# Patient Record
Sex: Female | Born: 1935 | Race: White | Hispanic: No | Smoking: Former smoker
Health system: Southern US, Community
[De-identification: ages and names within clinical notes are randomized; demographics above are authoritative.]

## PROBLEM LIST (undated history)

## (undated) DIAGNOSIS — R011 Cardiac murmur, unspecified: Secondary | ICD-10-CM

## (undated) DIAGNOSIS — F0390 Unspecified dementia without behavioral disturbance: Secondary | ICD-10-CM

## (undated) DIAGNOSIS — J449 Chronic obstructive pulmonary disease, unspecified: Secondary | ICD-10-CM

## (undated) DIAGNOSIS — I719 Aortic aneurysm of unspecified site, without rupture: Secondary | ICD-10-CM

## (undated) HISTORY — PX: FRACTURE SURGERY: SHX138

## (undated) HISTORY — PX: COLONOSCOPY: SHX174

## (undated) HISTORY — PX: ESOPHAGOGASTRODUODENOSCOPY: SHX1529

---

## 2016-02-03 DIAGNOSIS — E039 Hypothyroidism, unspecified: Secondary | ICD-10-CM | POA: Diagnosis present

## 2016-02-03 DIAGNOSIS — F0390 Unspecified dementia without behavioral disturbance: Secondary | ICD-10-CM | POA: Diagnosis present

## 2016-02-03 DIAGNOSIS — I10 Essential (primary) hypertension: Secondary | ICD-10-CM | POA: Diagnosis present

## 2016-05-04 DIAGNOSIS — K5730 Diverticulosis of large intestine without perforation or abscess without bleeding: Secondary | ICD-10-CM | POA: Insufficient documentation

## 2018-06-06 ENCOUNTER — Other Ambulatory Visit: Payer: Self-pay | Admitting: Student

## 2018-06-06 DIAGNOSIS — K5909 Other constipation: Secondary | ICD-10-CM

## 2018-06-06 DIAGNOSIS — R1084 Generalized abdominal pain: Secondary | ICD-10-CM

## 2018-06-06 DIAGNOSIS — R140 Abdominal distension (gaseous): Secondary | ICD-10-CM

## 2018-06-09 ENCOUNTER — Ambulatory Visit
Admission: RE | Admit: 2018-06-09 | Discharge: 2018-06-09 | Disposition: A | Discharge status: Home / Self Care | Payer: Medicare Other | Source: Ambulatory Visit | Attending: Student | Admitting: Student

## 2018-06-09 DIAGNOSIS — K5909 Other constipation: Secondary | ICD-10-CM | POA: Insufficient documentation

## 2018-06-09 DIAGNOSIS — M5136 Other intervertebral disc degeneration, lumbar region: Secondary | ICD-10-CM | POA: Diagnosis not present

## 2018-06-09 DIAGNOSIS — R140 Abdominal distension (gaseous): Secondary | ICD-10-CM | POA: Diagnosis not present

## 2018-06-09 DIAGNOSIS — K5730 Diverticulosis of large intestine without perforation or abscess without bleeding: Secondary | ICD-10-CM | POA: Diagnosis not present

## 2018-06-09 DIAGNOSIS — R1084 Generalized abdominal pain: Secondary | ICD-10-CM | POA: Insufficient documentation

## 2018-06-09 DIAGNOSIS — M5137 Other intervertebral disc degeneration, lumbosacral region: Secondary | ICD-10-CM | POA: Insufficient documentation

## 2018-06-09 DIAGNOSIS — N289 Disorder of kidney and ureter, unspecified: Secondary | ICD-10-CM | POA: Insufficient documentation

## 2018-06-09 DIAGNOSIS — N281 Cyst of kidney, acquired: Secondary | ICD-10-CM | POA: Diagnosis not present

## 2018-06-09 DIAGNOSIS — K4020 Bilateral inguinal hernia, without obstruction or gangrene, not specified as recurrent: Secondary | ICD-10-CM | POA: Insufficient documentation

## 2018-06-09 LAB — POCT I-STAT CREATININE: CREATININE: 0.5 mg/dL (ref 0.44–1.00)

## 2018-06-09 MED ORDER — IOPAMIDOL (ISOVUE-300) INJECTION 61%
85.0000 mL | Freq: Once | INTRAVENOUS | Status: AC | PRN
  Administered 2018-06-09: 85 mL via INTRAVENOUS

## 2019-01-24 IMAGING — CT CT ABD-PELV W/ CM
2 of 5 series · 14 of 46 positions shown, 16 images · IV contrast (iopamidol)
Comparison: None.

CLINICAL DATA: Protrusion of the right groin region, worsening of
abdominal bloating

EXAM:
CT ABDOMEN AND PELVIS WITH CONTRAST
TECHNIQUE: Multidetector CT imaging of the abdomen and pelvis was performed
using the standard protocol following bolus administration of
intravenous contrast.
CONTRAST:  85mL YO8HT8-LGG IOPAMIDOL (YO8HT8-LGG) INJECTION 61%

[Series 2: abd pelvis · axial · 0.71mm/px · z∈[-1574,-1224]mm · 11 of 85 slices shown, 13 images (1 of 2)]
[im 8/85  soft-tissue]
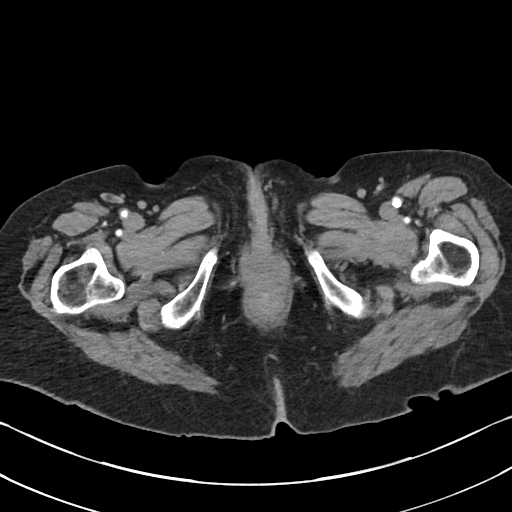
[im 8/85  bone]
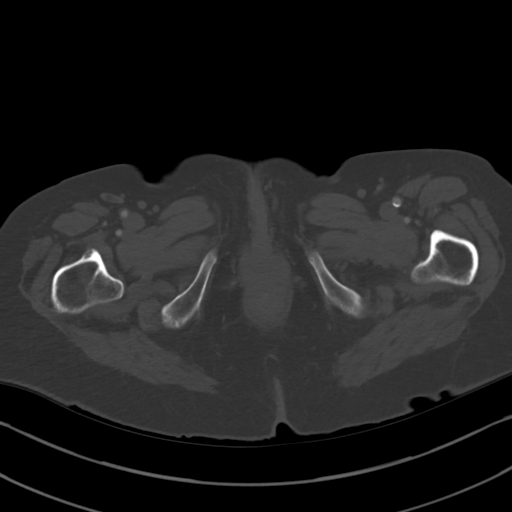
[im 15/85  soft-tissue]
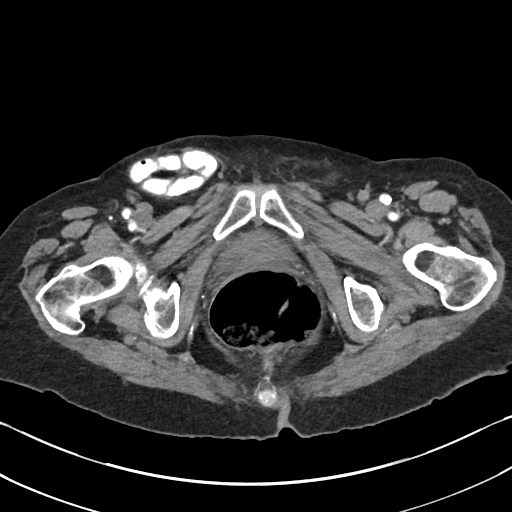
[im 22/85  soft-tissue]
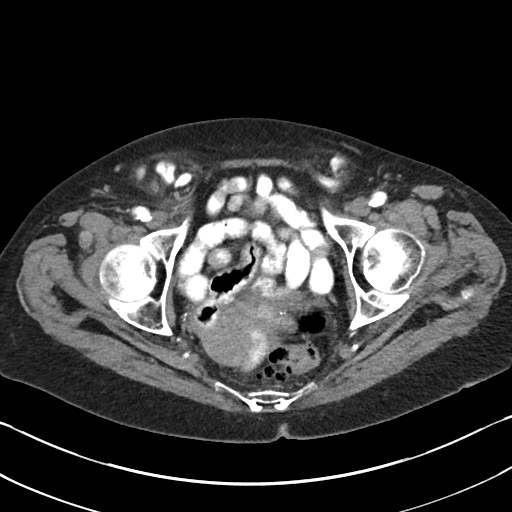
[im 29/85  soft-tissue]
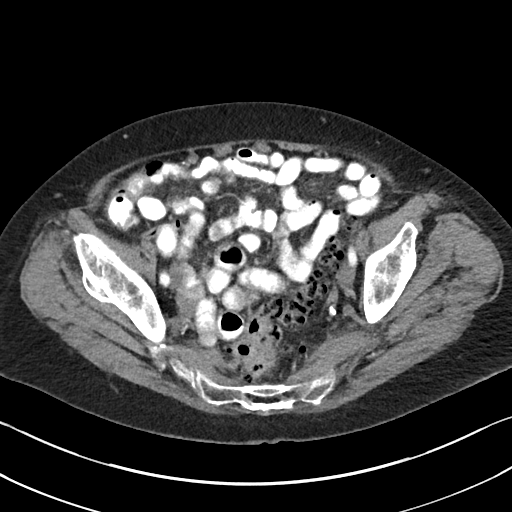
[im 36/85  soft-tissue]
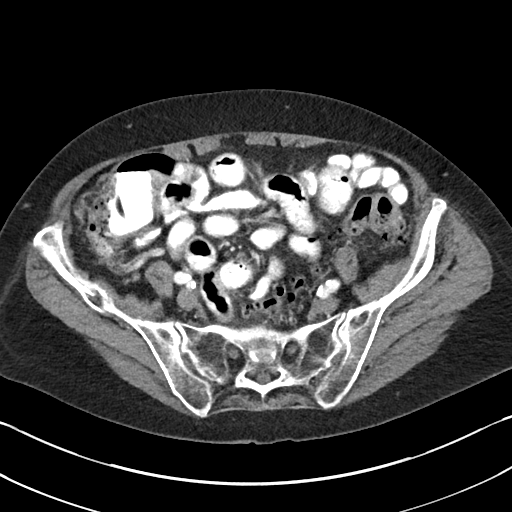
[im 43/85  soft-tissue]
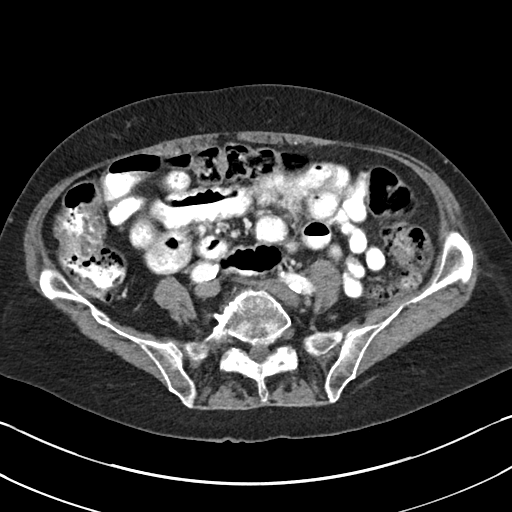
[im 50/85  soft-tissue]
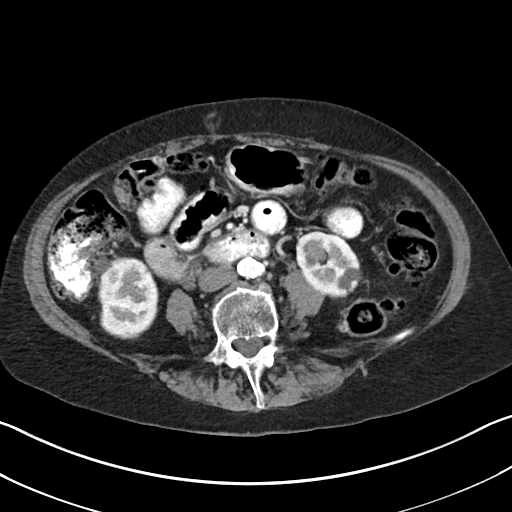
[im 57/85  soft-tissue]
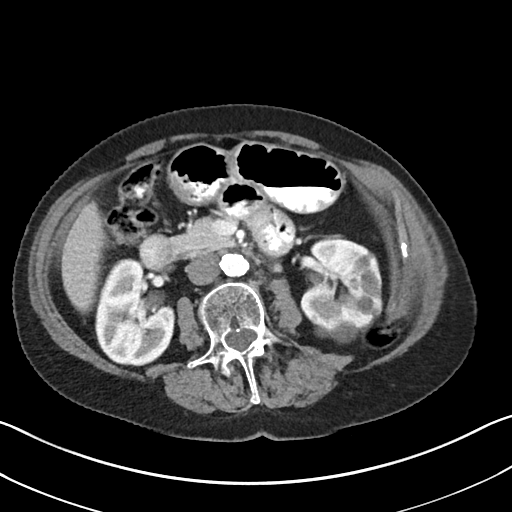
[im 64/85  soft-tissue]
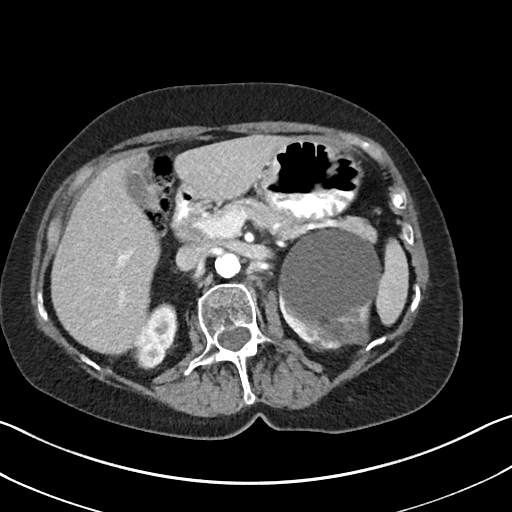
[im 64/85  bone]
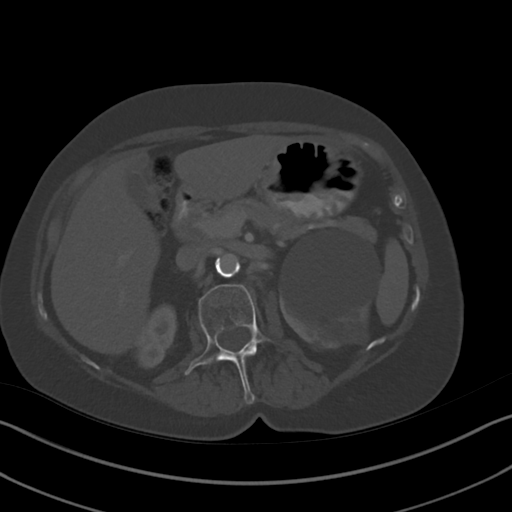
[im 71/85  soft-tissue]
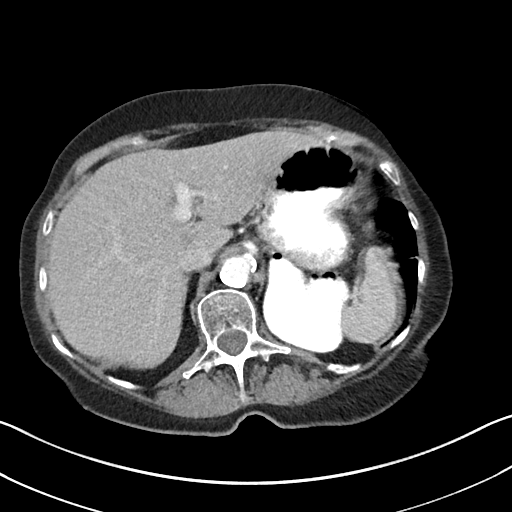
[im 78/85  soft-tissue]
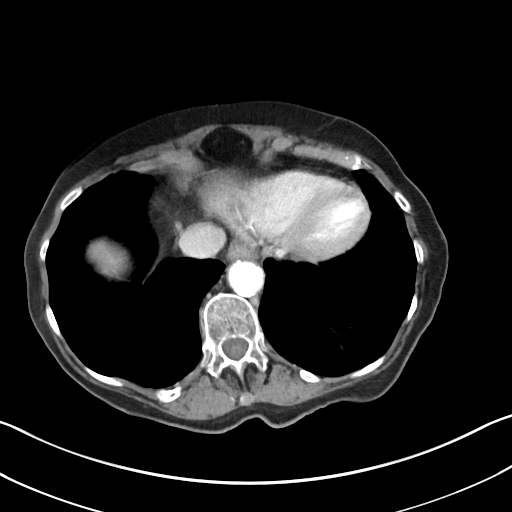

[Series 4: abd pelvis · coronal · 0.73mm/px · 3 of 130 slices shown (2 of 2)]
[im 44/130  soft-tissue]
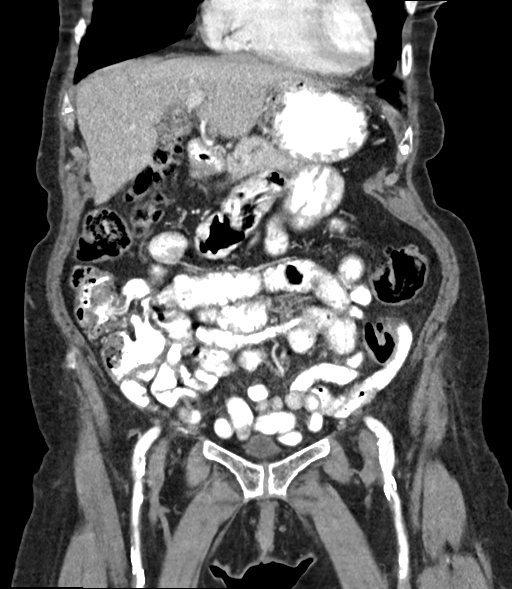
[im 58/130  soft-tissue]
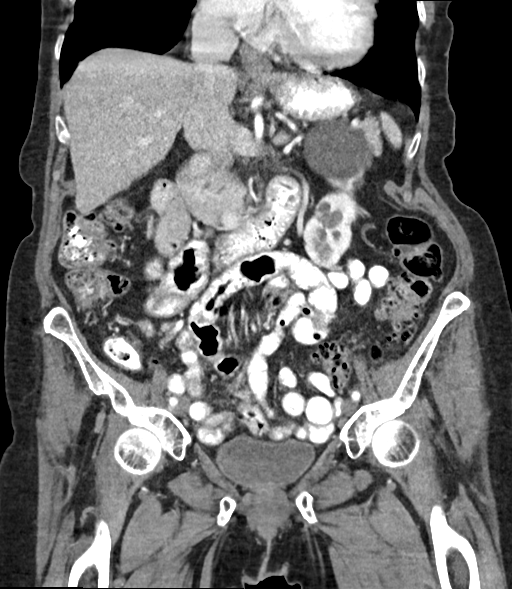
[im 72/130  soft-tissue]
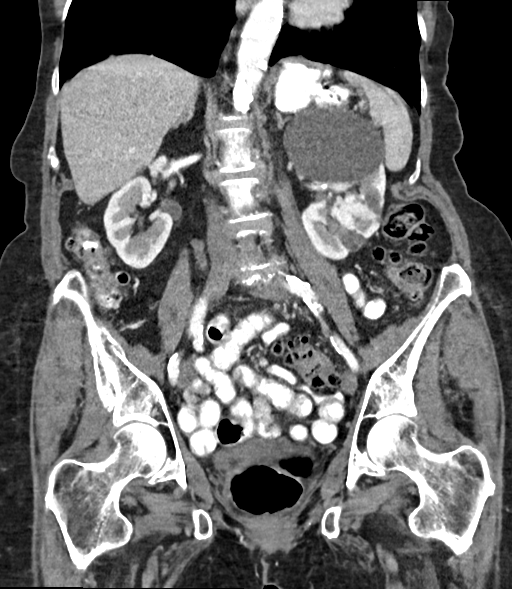

[14 of 46 positions shown; findings below may reference images not displayed]

FINDINGS: Lower chest: The lung bases are clear. Small Bochdalek's hernias
containing fat noted posteriorly bilaterally. Cardiomegaly is
present. No pericardial effusion is noted.

Hepatobiliary: The liver enhances with only a tiny low-attenuation
structure in the left lobe consistent with tiny cyst. No ductal
dilatation is seen. The gallbladder is contracted and no calcified
gallstones are noted.

Pancreas: The pancreas is normal in size and the pancreatic duct is
not dilated.

Spleen: The spleen is unremarkable.

Adrenals/Urinary Tract: The adrenal glands appear normal. Renal
cysts are noted bilaterally with the largest cysts the left. In the
upper pole on the left there is a cyst 6.3 x 7.0 cm. No solid renal
lesion is seen. There are several small nonobstructing left renal
calculi present. On delayed images there is no evidence of
hydronephrosis. The ureters appear normal in caliber. The urinary
bladder is not well distended but no abnormality is seen.

Stomach/Bowel: The stomach is moderately distended with oral
contrast and there is posterior extension of the fundus of the
stomach which could represent a large fundal diverticulum. No small
bowel dilatation is seen. There are multiple rectosigmoid colon
diverticula noted extending into the descending colon. Also
diverticula are scattered throughout the more proximal colon as
well. No diverticulitis is seen. The appendix and terminal ileum are
unremarkable. However, bilateral inguinal hernias are present, right
larger than left, containing loops of small bowel which are not
dilated. No fluid is seen within either canal.

Vascular/Lymphatic: The abdominal aorta is normal in caliber with
significant abdominal aortic atherosclerosis present. No adenopathy
is seen.

Reproductive: The uterus is normal in size. No adnexal lesion is
seen. No fluid is noted within pelvis.

Other: No anterior abdominal wall hernia is noted.

Musculoskeletal: There is very minimal retrolisthesis of L4 on L5
most likely due to degenerative change of the facet joints.
Degenerative disc disease is noted at both L4-5 and L5-S1 levels.
IMPRESSION: 1. Bilateral inguinal hernias, right larger than left, containing
nondilated loops of small bowel.
2. Nonobstructing left renal lesions. Bilateral renal cysts, the
largest on the left.
3. Multiple colonic diverticula.
4. Degenerative disc disease at L4-5 and L5-S1.

## 2019-02-28 DIAGNOSIS — J449 Chronic obstructive pulmonary disease, unspecified: Secondary | ICD-10-CM | POA: Diagnosis present

## 2019-02-28 DIAGNOSIS — Z720 Tobacco use: Secondary | ICD-10-CM | POA: Insufficient documentation

## 2019-05-05 ENCOUNTER — Telehealth: Payer: Self-pay | Admitting: *Deleted

## 2019-05-05 NOTE — Telephone Encounter (Signed)
Left voicemail regarding lung screening referral, encouraged patient to call back.

## 2019-05-18 ENCOUNTER — Telehealth: Payer: Self-pay | Admitting: *Deleted

## 2019-05-18 DIAGNOSIS — Z122 Encounter for screening for malignant neoplasm of respiratory organs: Secondary | ICD-10-CM

## 2019-05-18 DIAGNOSIS — Z87891 Personal history of nicotine dependence: Secondary | ICD-10-CM

## 2019-05-18 NOTE — Telephone Encounter (Signed)
Received referral for initial lung cancer screening scan. Contacted patient and obtained smoking history,(current, 63 pack year) as well as answering questions related to screening process. Patient denies signs of lung cancer such as weight loss or hemoptysis. Patient denies comorbidity that would prevent curative treatment if lung cancer were found. Patient is scheduled for shared decision making visit and CT scan on 05/25/19 at 1245pm.

## 2019-05-25 ENCOUNTER — Ambulatory Visit
Admission: RE | Admit: 2019-05-25 | Discharge: 2019-05-25 | Disposition: A | Discharge status: Home / Self Care | Payer: Medicare Other | Source: Ambulatory Visit | Attending: Oncology | Admitting: Oncology

## 2019-05-25 ENCOUNTER — Other Ambulatory Visit: Payer: Self-pay

## 2019-05-25 ENCOUNTER — Encounter: Payer: Self-pay | Admitting: Oncology

## 2019-05-25 ENCOUNTER — Inpatient Hospital Stay: Payer: Medicare Other | Attending: Oncology | Admitting: Oncology

## 2019-05-25 DIAGNOSIS — F17210 Nicotine dependence, cigarettes, uncomplicated: Secondary | ICD-10-CM | POA: Insufficient documentation

## 2019-05-25 DIAGNOSIS — J432 Centrilobular emphysema: Secondary | ICD-10-CM | POA: Insufficient documentation

## 2019-05-25 DIAGNOSIS — Z122 Encounter for screening for malignant neoplasm of respiratory organs: Secondary | ICD-10-CM | POA: Diagnosis present

## 2019-05-25 DIAGNOSIS — I712 Thoracic aortic aneurysm, without rupture: Secondary | ICD-10-CM | POA: Diagnosis not present

## 2019-05-25 DIAGNOSIS — Z87891 Personal history of nicotine dependence: Secondary | ICD-10-CM

## 2019-05-26 ENCOUNTER — Encounter: Payer: Self-pay | Admitting: *Deleted

## 2019-05-26 ENCOUNTER — Telehealth: Payer: Self-pay | Admitting: *Deleted

## 2019-05-26 NOTE — Telephone Encounter (Signed)
Notified patient's daughter of LDCT lung cancer screening program results with recommendation for 12 month follow up imaging. Also notified of incidental findings noted below and is encouraged to discuss further with PCP who will receive a copy of this note and/or the CT report. Discussed importance of 12 month follow up for ascending aortic aneurysm and small lung nodules. Also discussed that patient will not be followed in lung screening program due to age but can be followed by her PCP or by our lung nodule program.   IMPRESSION: 1. Lung-RADS 2, benign appearance or behavior. Continue annual screening with low-dose chest CT without contrast in 12 months. 2. 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 Recommend annual imaging followup by CTA or MRA. This recommendation follows 2010 ACCF/AHA/AATS/ACR/ASA/SCA/SCAI/SIR/STS/SVM Guidelines for the Diagnosis and Management of Patients with Thoracic Aortic Disease. Circulation. 2010; 121: I338-S505. Aortic aneurysm NOS (ICD10-I71.9) 3. Enlargement of the main pulmonary arteries is compatible with pulmonary arterial hypertension.

## 2019-05-29 NOTE — Progress Notes (Signed)
Virtual Visit via Video Note  I connected with Paige Parker on 05/29/19 at 12:45 PM EDT by a video enabled telemedicine application and verified that I am speaking with the correct person using two identifiers.  Location: Patient: [facility] Provider: Home   I discussed the limitations of evaluation and management by telemedicine and the availability of in person appointments. The patient expressed understanding and agreed to proceed.  I discussed the assessment and treatment plan with the patient. The patient was provided an opportunity to ask questions and all were answered. The patient agreed with the plan and demonstrated an understanding of the instructions.   The patient was advised to call back or seek an in-person evaluation if the symptoms worsen or if the condition fails to improve as anticipated.   In accordance with CMS guidelines, patient has met eligibility criteria including age, absence of signs or symptoms of lung cancer.  Social History   Tobacco Use  . Smoking status: Current Every Day Smoker    Packs/day: 1.00    Years: 63.00    Pack years: 63.00    Types: Cigarettes  Substance Use Topics  . Alcohol use: Not on file  . Drug use: Not on file      A shared decision-making session was conducted prior to the performance of CT scan. This includes one or more decision aids, includes benefits and harms of screening, follow-up diagnostic testing, over-diagnosis, false positive rate, and total radiation exposure.   Counseling on the importance of adherence to annual lung cancer LDCT screening, impact of co-morbidities, and ability or willingness to undergo diagnosis and treatment is imperative for compliance of the program.   Counseling on the importance of continued smoking cessation for former smokers; the importance of smoking cessation for current smokers, and information about tobacco cessation interventions have been given to patient including [place] and [address]  quit [place] programs.   Written order for lung cancer screening with LDCT has been given to the patient and any and all questions have been answered to the best of my abilities.    Yearly follow up will be coordinated by Burgess Estelle, Thoracic Navigator.  I provided 10 minutes of face-to-face video visit time during this encounter, and > 50% was spent counseling as documented under my assessment & plan.   Jacquelin Hawking, NP

## 2019-08-31 DIAGNOSIS — I7121 Aneurysm of the ascending aorta, without rupture: Secondary | ICD-10-CM | POA: Diagnosis present

## 2020-06-06 ENCOUNTER — Other Ambulatory Visit: Payer: Self-pay | Admitting: Internal Medicine

## 2020-06-06 DIAGNOSIS — I7121 Aneurysm of the ascending aorta, without rupture: Secondary | ICD-10-CM

## 2020-06-06 DIAGNOSIS — Z0000 Encounter for general adult medical examination without abnormal findings: Secondary | ICD-10-CM

## 2020-10-15 ENCOUNTER — Other Ambulatory Visit: Payer: Self-pay | Admitting: Oncology

## 2020-10-15 DIAGNOSIS — R911 Solitary pulmonary nodule: Secondary | ICD-10-CM

## 2020-10-15 NOTE — Progress Notes (Signed)
  Pulmonary Nodule Clinic Telephone Note [facility]   Received referral from Glenna Fellows our low-dose CT screening nurse navigator.  HPI: Mrs. Alumbaugh is a 84 year old female with past medical history significant for COPD, B12 deficiency, arthritis, anemia, hypothyroidism, dementia, hypertension, GERD, basal cell carcinoma and iron deficiency anemia who was previously part of our low-dose CT screening program but unfortunately does not meet criteria.  Her last CT scan from June 2020 showed a 4.3 mm pulmonary nodule.  Follow-up recommended.  Review and Recommendations: I personally reviewed all patient's previous imaging including most recent low-dose CT scan from June 2020.  I recommend follow-up with noncontrast chest CT in the next 1 to 2 weeks given smoking history.  Social History:   Tobacco Use:   . Smoking Tobacco Use: Not on file  . Smokeless Tobacco Use: Not on file     High risk factors include: History of heavy smoking, exposure to asbestos, radium or uranium, personal family history of lung cancer, older age, sex (females greater than males), race (black and native [place] greater than weight), marginal speculation, upper lobe location, multiplicity (less than 5 nodules increases risk for malignancy) and emphysema and/or pulmonary fibrosis.   This recommendation follows the consensus statement: Guidelines for Management of Incidental Pulmonary Nodules Detected on CT Images: From the Fleischner Society 2017; Radiology 2017; 284:228-243.    I have placed order for CT scan without contrast to be completed approximately 1 to 2 weeks.  Disposition: Order placed for repeat CT chest. Will notify Micael Hampshire in scheduling. [address] to call patient with appointment date and time. Return to pulmonary nodule clinic a few days after his repeat imaging to discuss results and plan moving forward.  Durenda Hurt, NP 10/15/2020 3:52 PM

## 2020-10-16 ENCOUNTER — Telehealth: Payer: Self-pay | Admitting: *Deleted

## 2020-10-16 NOTE — Telephone Encounter (Signed)
Referral received for pt to be seen in Lung Nodule Clinic for further workup of incidental lung nodule with follow up CT scan. Left message with patient to call back to discuss clinic and review recommendations and upcoming appts including follow up CT scan and visit with Jenny Burns, NP to discuss results. Awaiting call back.  

## 2020-10-28 ENCOUNTER — Telehealth: Payer: Self-pay | Admitting: *Deleted

## 2020-10-28 NOTE — Telephone Encounter (Signed)
Message left with pt's daughter, Darl Pikes, regarding upcoming appts in the lung nodule clinic. Appts left of voicemail. Instructed to call back if need to be rescheduled.

## 2020-10-29 ENCOUNTER — Ambulatory Visit
Admission: RE | Admit: 2020-10-29 | Discharge: 2020-10-29 | Disposition: A | Discharge status: Home / Self Care | Payer: Medicare Other | Source: Ambulatory Visit | Attending: Oncology | Admitting: Oncology

## 2020-10-29 ENCOUNTER — Other Ambulatory Visit: Payer: Self-pay

## 2020-10-29 DIAGNOSIS — R911 Solitary pulmonary nodule: Secondary | ICD-10-CM | POA: Diagnosis not present

## 2020-10-30 ENCOUNTER — Inpatient Hospital Stay: Payer: Medicare Other | Attending: Oncology | Admitting: Oncology

## 2020-10-30 DIAGNOSIS — E039 Hypothyroidism, unspecified: Secondary | ICD-10-CM | POA: Diagnosis not present

## 2020-10-30 DIAGNOSIS — F0390 Unspecified dementia without behavioral disturbance: Secondary | ICD-10-CM | POA: Insufficient documentation

## 2020-10-30 DIAGNOSIS — I700 Atherosclerosis of aorta: Secondary | ICD-10-CM | POA: Diagnosis not present

## 2020-10-30 DIAGNOSIS — F17210 Nicotine dependence, cigarettes, uncomplicated: Secondary | ICD-10-CM | POA: Insufficient documentation

## 2020-10-30 DIAGNOSIS — J449 Chronic obstructive pulmonary disease, unspecified: Secondary | ICD-10-CM | POA: Diagnosis not present

## 2020-10-30 DIAGNOSIS — K219 Gastro-esophageal reflux disease without esophagitis: Secondary | ICD-10-CM | POA: Insufficient documentation

## 2020-10-30 DIAGNOSIS — I2510 Atherosclerotic heart disease of native coronary artery without angina pectoris: Secondary | ICD-10-CM | POA: Diagnosis not present

## 2020-10-30 DIAGNOSIS — Z85828 Personal history of other malignant neoplasm of skin: Secondary | ICD-10-CM | POA: Diagnosis not present

## 2020-10-30 DIAGNOSIS — I10 Essential (primary) hypertension: Secondary | ICD-10-CM | POA: Diagnosis not present

## 2020-10-30 DIAGNOSIS — R911 Solitary pulmonary nodule: Secondary | ICD-10-CM | POA: Diagnosis present

## 2020-10-30 NOTE — Progress Notes (Signed)
Pulmonary Nodule Clinic Consult note [facility]  [phone] [phone]  Parker Care Team: Barbette Reichmann, MD as PCP - General (Internal Medicine)   Name of the Parker: Paige Parker  [id number]  1936/05/23   Date of visit: 10/30/2020   Diagnosis- Lung Nodule  Chief complaint/ Reason for visit- Pulmonary Nodule Clinic Initial Visit  Past Medical History:  Parker is managed/referred by LDCT screening program   Paige Parker is a 84 year old female with past medical history significant for COPD, B12 deficiency, arthritis, anemia, hypothyroidism, dementia, hypertension, GERD, basal cell carcinoma and iron deficiency anemia who was previously part of our low-dose CT screening program but unfortunately does not meet criteria.  Her last CT scan from June 2020 showed a 4.3 mm pulmonary nodule. Follow-up recommended.  Interval history-Paige Parker presents today to discuss her recent CT scan.  She is doing well and lives at [facility] independent living.  Her daughter lives about a mile down the street.  She is mainly independent.  She does use a walker for long distances and balance.   She is a current smoker.  She smokes about a pack every week.  She has smoked for about 55 years.  She used to smoke a pack per day.  She currently feels well and denies any problems with her breathing.  She has quit smoking in the past for several years.  Her husband used to smoke.  He is now deceased.  She denies any occupational exposures.  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  She denies any known family history of cancer.  She does have a personal history of basal cell carcinoma on her face which was removed.  She is followed closely by her primary care provider Dr. Marcello Fennel.  Her TSH was significantly elevated on her last visit.  She is scheduled for follow-up in the next couple weeks.  She has adjusted her  Synthroid to 125 mcg per the request of Dr. Marcello Fennel. Denies any neurologic complaints. Denies recent fevers or illnesses. Denies any easy bleeding or bruising. Reports good appetite and denies weight loss. Denies chest pain. Denies any nausea, vomiting, constipation, or diarrhea. Denies urinary complaints. Parker offers no further specific complaints today.   ECOG FS:1 - Symptomatic but completely ambulatory  Review of systems- Review of Systems  Constitutional: Negative.  Negative for chills, fever, malaise/fatigue and weight loss.  HENT: Negative for congestion, ear pain and tinnitus.   Eyes: Negative.  Negative for blurred vision and double vision.  Respiratory: Negative.  Negative for cough, sputum production and shortness of breath.   Cardiovascular: Negative.  Negative for chest pain, palpitations and leg swelling.  Gastrointestinal: Negative.  Negative for abdominal pain, constipation, diarrhea, nausea and vomiting.  Genitourinary: Negative for dysuria, frequency and urgency.  Musculoskeletal: Negative for back pain and falls.  Skin: Negative.  Negative for rash.  Neurological: Negative.  Negative for weakness and headaches.  Endo/Heme/Allergies: Negative.  Does not bruise/bleed easily.  Psychiatric/Behavioral: Negative.  Negative for depression. The Parker is not nervous/anxious and does not have insomnia.      No Known Allergies   No past medical history on file.   Social History   Socioeconomic History  . Marital status: Widowed    Spouse name: Not on file  . Number of children: Not on file  . Years of education: Not on file  . Highest education level: Not on file  Occupational History  . Not on file  Tobacco Use  . Smoking status: Current Every Day Smoker    Packs/day: 1.00    Years: 63.00    Pack years: 63.00    Types: Cigarettes  Substance and Sexual Activity  . Alcohol use: Not on file  . Drug use: Not on file  . Sexual activity: Not on file  Other Topics  Concern  . Not on file  Social History Narrative  . Not on file   Social Determinants of Health   Financial Resource Strain:   . Difficulty of Paying Living Expenses: Not on file  Food Insecurity:   . Worried About Programme researcher, broadcasting/film/videounning Out of Food in the Last Year: Not on file  . Ran Out of Food in the Last Year: Not on file  Transportation Needs:   . Lack of Transportation (Medical): Not on file  . Lack of Transportation (Non-Medical): Not on file  Physical Activity:   . Days of Exercise per Week: Not on file  . Minutes of Exercise per Session: Not on file  Stress:   . Feeling of Stress : Not on file  Social Connections:   . Frequency of Communication with Friends and Family: Not on file  . Frequency of Social Gatherings with Friends and Family: Not on file  . Attends Religious Services: Not on file  . Active Member of Clubs or Organizations: Not on file  . Attends BankerClub or Organization Meetings: Not on file  . Marital Status: Not on file  Intimate Partner Violence:   . Fear of Current or Ex-Partner: Not on file  . Emotionally Abused: Not on file  . Physically Abused: Not on file  . Sexually Abused: Not on file    No family history on file.  No current outpatient medications on file.  Physical exam: There were no vitals filed for Paige visit. Physical Exam   CMP Latest Ref Rng & Units 06/09/2018  Creatinine 0.44 - 1.00 mg/dL 4.090.50   No flowsheet data found.  No images are attached to the encounter.  CT Chest Wo Contrast  Result Date: 10/29/2020 CLINICAL DATA:  Follow-up pulmonary nodules EXAM: CT CHEST WITHOUT CONTRAST TECHNIQUE: Multidetector CT imaging of the chest was performed following the standard protocol without IV contrast. COMPARISON:  05/25/2019 FINDINGS: Cardiovascular: Aortic atherosclerosis. Unchanged enlargement of the tubular ascending thoracic aorta, measuring up to 4.4 x 4.3 cm. Normal heart size. Three-vessel coronary artery calcifications. Gross enlargement of  the main pulmonary artery, measuring up to 4.7 cm in caliber. No pericardial effusion. Mediastinum/Nodes: No enlarged mediastinal, hilar, or axillary lymph nodes. Thyroid gland, trachea, and esophagus demonstrate no significant findings. Lungs/Pleura: Moderate centrilobular emphysema. Diffuse bilateral bronchial wall thickening. Occasional small pulmonary nodules are stable and definitively benign, for example a 4 mm nodule of the left lower lobe (series 3, image 135). No pleural effusion or pneumothorax. Upper Abdomen: No acute abnormality. Large diverticulum of the gastric fundus (series 2, image 145). Large, partially imaged exophytic cyst of the superior pole of the left kidney. Musculoskeletal: No chest wall mass or suspicious bone lesions identified. IMPRESSION: 1. Occasional small pulmonary nodules are stable and definitively benign. No further routine CT follow-up is required for these nodules. 2. Moderate emphysema and diffuse bilateral bronchial wall thickening. 3. Unchanged enlargement of the tubular ascending thoracic aorta, measuring up to 4.4 x 4.3 cm. Recommend annual imaging followup by CTA or MRA. Paige recommendation follows 2010 ACCF/AHA/AATS/ACR/ASA/SCA/SCAI/SIR/STS/SVM Guidelines for the Diagnosis and Management of Patients with  Thoracic Aortic Disease. Circulation. 2010; 121: W098-J191. Aortic aneurysm NOS (ICD10-I71.9) 4. Gross enlargement of the main pulmonary artery, measuring up to 4.7 cm in caliber, which can be seen in pulmonary hypertension. 5. Coronary artery disease. Aortic Atherosclerosis (ICD10-I70.0) and Emphysema (ICD10-J43.9). Electronically Signed   By: Lauralyn Primes M.D.   On: 10/29/2020 10:24     Assessment and plan- Parker is a 84 y.o. female who presents to pulmonary nodule clinic for follow-up of incidental lung nodules.     CT chest without contrast from from 10/29/2020 shows occasional small pulmonary nodules that are stable and a definitively benign.  No further  routine CT follow-up is required for these nodules.  Moderate emphysema and diffuse bilateral bronchial wall thickening.  Unchanged enlargement of the tubular ascending thoracic aorta measuring up to 4.4 x 4.3 cm.  Recommend annual imaging follow-up by CTA or M RA.  There is a gross enlargement of the main pulmonary artery measuring up to 4.7 cm in caliber which can be seen in pulmonary hypertension.   Calculating malignancy probability of a pulmonary nodule: Risk factors include: 1.  Age. 2.  Cancer history. 3.  Diameter of pulmonary nodule and mm 4.  Location 5.  Smoking history 6.  Spiculation present   Based on risk factors, Paige Parker is  high risk for the development of lung cancer given her smoking history.  She no longer meets criteria for low-dose CT screening program and her's lung nodules have remained the same size for several years now.  No additional follow-up is needed at Paige time.  Will defer back to her primary care provider Dr. Marcello Fennel.     During our visit, we discussed pulmonary nodules are a common incidental finding and are often how lung cancer is discovered.  Lung cancer survival is directly related to the stage at diagnosis.  We discussed that nodules can vary in presentation from solitary pulmonary nodules to masses, 2 groundglass opacities and multiple nodules.  Pulmonary nodules in the majority of cases are benign but the probability of these becoming malignant cannot be undermined.  Early identification of malignant nodules could lead to early diagnosis and increased survival.   We discussed the probability of pulmonary nodules becoming malignant increase with age, pack years of tobacco use, size/characteristics of the nodule and location; with upper lobe involvement being most worrisome.   We discussed the goal of our clinic is to thoroughly evaluate each nodule, developed a comprehensive, individualized plan of care utilizing the most advanced technology and  significantly reduce the time from detection to treatment.  A dedicated pulmonary nodule clinic has proven to indeed expedite the detection and treatment of lung cancer.   Parker education in fact sheet provided along with most recent CT scans.  During our visit today, we discussed her past medical history, social history and family history.  She has no family history of cancer.  She has a personal history of basal cell carcinoma on her face several years ago.  Otherwise she is healthy.  Discussed occupational exposures which is none.   Disposition: No additional follow-up is needed in our clinic. Defer back to primary care provider. Recommend CTA or MRA in 1 year to follow-up on thoracic aorta aneurysm.   Visit Diagnosis 1. Pulmonary nodule     Parker expressed understanding and was in agreement with Paige plan. She also understands that She can call clinic at any time with any questions, concerns, or complaints.   Greater than 50% was spent  in counseling and coordination of care with Paige Parker including but not limited to discussion of the relevant topics above (See A&P) including, but not limited to diagnosis and management of acute and chronic medical conditions.   Thank you for allowing me to participate in the care of Paige very pleasant Parker.    Mauro Kaufmann, NP [facility] Cell - [phone] Pager- [phone] 10/30/2020 1:33 PM

## 2022-04-09 ENCOUNTER — Other Ambulatory Visit: Payer: Self-pay | Admitting: Internal Medicine

## 2022-04-09 DIAGNOSIS — I7121 Aneurysm of the ascending aorta, without rupture: Secondary | ICD-10-CM

## 2022-04-22 ENCOUNTER — Ambulatory Visit
Admission: RE | Admit: 2022-04-22 | Discharge: 2022-04-22 | Disposition: A | Discharge status: Home / Self Care | Payer: Medicare Other | Source: Ambulatory Visit | Attending: Internal Medicine | Admitting: Internal Medicine

## 2022-04-22 DIAGNOSIS — I7121 Aneurysm of the ascending aorta, without rupture: Secondary | ICD-10-CM | POA: Insufficient documentation

## 2022-04-22 MED ORDER — IOHEXOL 350 MG/ML SOLN
75.0000 mL | Freq: Once | INTRAVENOUS | Status: AC | PRN
  Administered 2022-04-22: 75 mL via INTRAVENOUS

## 2022-07-30 ENCOUNTER — Other Ambulatory Visit: Payer: Self-pay

## 2022-07-30 ENCOUNTER — Emergency Department: Payer: Medicare Other

## 2022-07-30 ENCOUNTER — Inpatient Hospital Stay
Admission: EM | Admit: 2022-07-30 | Discharge: 2022-08-01 | DRG: 378 | Disposition: A | Discharge status: Home / Self Care | Payer: Medicare Other | Source: Ambulatory Visit | Attending: Obstetrics and Gynecology | Admitting: Obstetrics and Gynecology

## 2022-07-30 ENCOUNTER — Encounter: Payer: Self-pay | Admitting: Emergency Medicine

## 2022-07-30 DIAGNOSIS — K921 Melena: Secondary | ICD-10-CM

## 2022-07-30 DIAGNOSIS — Z79899 Other long term (current) drug therapy: Secondary | ICD-10-CM | POA: Diagnosis not present

## 2022-07-30 DIAGNOSIS — Z79890 Hormone replacement therapy (postmenopausal): Secondary | ICD-10-CM

## 2022-07-30 DIAGNOSIS — D62 Acute posthemorrhagic anemia: Secondary | ICD-10-CM | POA: Diagnosis present

## 2022-07-30 DIAGNOSIS — I7121 Aneurysm of the ascending aorta, without rupture: Secondary | ICD-10-CM | POA: Diagnosis present

## 2022-07-30 DIAGNOSIS — K529 Noninfective gastroenteritis and colitis, unspecified: Secondary | ICD-10-CM | POA: Diagnosis present

## 2022-07-30 DIAGNOSIS — E876 Hypokalemia: Secondary | ICD-10-CM | POA: Diagnosis present

## 2022-07-30 DIAGNOSIS — K31811 Angiodysplasia of stomach and duodenum with bleeding: Principal | ICD-10-CM | POA: Diagnosis present

## 2022-07-30 DIAGNOSIS — K449 Diaphragmatic hernia without obstruction or gangrene: Secondary | ICD-10-CM | POA: Diagnosis present

## 2022-07-30 DIAGNOSIS — E162 Hypoglycemia, unspecified: Secondary | ICD-10-CM | POA: Diagnosis not present

## 2022-07-30 DIAGNOSIS — Z87891 Personal history of nicotine dependence: Secondary | ICD-10-CM

## 2022-07-30 DIAGNOSIS — J449 Chronic obstructive pulmonary disease, unspecified: Secondary | ICD-10-CM | POA: Diagnosis present

## 2022-07-30 DIAGNOSIS — F0390 Unspecified dementia without behavioral disturbance: Secondary | ICD-10-CM | POA: Diagnosis present

## 2022-07-30 DIAGNOSIS — K31819 Angiodysplasia of stomach and duodenum without bleeding: Secondary | ICD-10-CM

## 2022-07-30 DIAGNOSIS — E039 Hypothyroidism, unspecified: Secondary | ICD-10-CM | POA: Diagnosis present

## 2022-07-30 DIAGNOSIS — I10 Essential (primary) hypertension: Secondary | ICD-10-CM | POA: Diagnosis present

## 2022-07-30 DIAGNOSIS — K922 Gastrointestinal hemorrhage, unspecified: Secondary | ICD-10-CM

## 2022-07-30 HISTORY — DX: Cardiac murmur, unspecified: R01.1

## 2022-07-30 HISTORY — DX: Aortic aneurysm of unspecified site, without rupture: I71.9

## 2022-07-30 HISTORY — DX: Unspecified dementia, unspecified severity, without behavioral disturbance, psychotic disturbance, mood disturbance, and anxiety: F03.90

## 2022-07-30 HISTORY — DX: Chronic obstructive pulmonary disease, unspecified: J44.9

## 2022-07-30 LAB — CBC WITH DIFFERENTIAL/PLATELET
Abs Immature Granulocytes: 0.92 10*3/uL — ABNORMAL HIGH (ref 0.00–0.07)
Basophils Absolute: 0 10*3/uL (ref 0.0–0.1)
Basophils Relative: 0 %
Eosinophils Absolute: 0.1 10*3/uL (ref 0.0–0.5)
Eosinophils Relative: 0 %
HCT: 19.3 % — ABNORMAL LOW (ref 36.0–46.0)
Hemoglobin: 5.6 g/dL — ABNORMAL LOW (ref 12.0–15.0)
Immature Granulocytes: 7 %
Lymphocytes Relative: 9 %
Lymphs Abs: 1.2 10*3/uL (ref 0.7–4.0)
MCH: 23.1 pg — ABNORMAL LOW (ref 26.0–34.0)
MCHC: 29 g/dL — ABNORMAL LOW (ref 30.0–36.0)
MCV: 79.8 fL — ABNORMAL LOW (ref 80.0–100.0)
Monocytes Absolute: 0.8 10*3/uL (ref 0.1–1.0)
Monocytes Relative: 6 %
Neutro Abs: 10.4 10*3/uL — ABNORMAL HIGH (ref 1.7–7.7)
Neutrophils Relative %: 78 %
Platelets: 559 10*3/uL — ABNORMAL HIGH (ref 150–400)
RBC: 2.42 MIL/uL — ABNORMAL LOW (ref 3.87–5.11)
RDW: 17.3 % — ABNORMAL HIGH (ref 11.5–15.5)
Smear Review: NORMAL
WBC: 13.3 10*3/uL — ABNORMAL HIGH (ref 4.0–10.5)
nRBC: 1 % — ABNORMAL HIGH (ref 0.0–0.2)

## 2022-07-30 LAB — COMPREHENSIVE METABOLIC PANEL
ALT: 14 U/L (ref 0–44)
AST: 19 U/L (ref 15–41)
Albumin: 2.8 g/dL — ABNORMAL LOW (ref 3.5–5.0)
Alkaline Phosphatase: 66 U/L (ref 38–126)
Anion gap: 11 (ref 5–15)
BUN: 17 mg/dL (ref 8–23)
CO2: 25 mmol/L (ref 22–32)
Calcium: 8.4 mg/dL — ABNORMAL LOW (ref 8.9–10.3)
Chloride: 101 mmol/L (ref 98–111)
Creatinine, Ser: 0.61 mg/dL (ref 0.44–1.00)
GFR, Estimated: >60 mL/min (ref >60)
Glucose, Bld: 84 mg/dL (ref 70–99)
Potassium: 3.2 mmol/L — ABNORMAL LOW (ref 3.5–5.1)
Sodium: 137 mmol/L (ref 135–145)
Total Bilirubin: 0.7 mg/dL (ref 0.3–1.2)
Total Protein: 6.6 g/dL (ref 6.5–8.1)

## 2022-07-30 LAB — ABO/RH: ABO/RH(D): A POS

## 2022-07-30 LAB — APTT: aPTT: 27 seconds (ref 24–36)

## 2022-07-30 LAB — PROTIME-INR
INR: 1.2 (ref 0.8–1.2)
Prothrombin Time: 14.8 seconds (ref 11.4–15.2)

## 2022-07-30 LAB — TROPONIN I (HIGH SENSITIVITY)
Troponin I (High Sensitivity): 7 ng/L (ref <18)
Troponin I (High Sensitivity): 5 ng/L (ref <18)

## 2022-07-30 MED ORDER — ACETAMINOPHEN 325 MG PO TABS: 650.0000 mg | ORAL_TABLET | Freq: Four times a day (QID) | ORAL | Status: DC | PRN

## 2022-07-30 MED ORDER — ONDANSETRON HCL 4 MG PO TABS: 4.0000 mg | ORAL_TABLET | Freq: Four times a day (QID) | ORAL | Status: DC | PRN

## 2022-07-30 MED ORDER — SODIUM CHLORIDE 0.9 % IV SOLN: 10.0000 mL/h | Freq: Once | INTRAVENOUS | Status: DC

## 2022-07-30 MED ORDER — POTASSIUM CHLORIDE 10 MEQ/100ML IV SOLN
10.0000 meq | INTRAVENOUS | Status: AC
  Administered 2022-07-30 – 2022-07-31 (×4): 10 meq via INTRAVENOUS
  Filled 2022-07-30 (×4): qty 100

## 2022-07-30 MED ORDER — ONDANSETRON HCL 4 MG/2ML IJ SOLN: 4.0000 mg | Freq: Four times a day (QID) | INTRAMUSCULAR | Status: DC | PRN

## 2022-07-30 MED ORDER — ALBUTEROL SULFATE (2.5 MG/3ML) 0.083% IN NEBU: 2.5000 mg | INHALATION_SOLUTION | RESPIRATORY_TRACT | Status: DC | PRN

## 2022-07-30 MED ORDER — SODIUM CHLORIDE 0.9 % IV SOLN: INTRAVENOUS | Status: DC

## 2022-07-30 MED ORDER — PANTOPRAZOLE SODIUM 40 MG IV SOLR
40.0000 mg | Freq: Once | INTRAVENOUS | Status: AC
  Administered 2022-07-30: 40 mg via INTRAVENOUS
  Filled 2022-07-30: qty 10

## 2022-07-30 MED ORDER — ACETAMINOPHEN 650 MG RE SUPP: 650.0000 mg | Freq: Four times a day (QID) | RECTAL | Status: DC | PRN

## 2022-07-30 MED ORDER — PANTOPRAZOLE SODIUM 40 MG IV SOLR
40.0000 mg | Freq: Two times a day (BID) | INTRAVENOUS | Status: DC
  Administered 2022-07-31 – 2022-08-01 (×3): 40 mg via INTRAVENOUS
  Filled 2022-07-30 (×3): qty 10

## 2022-07-30 NOTE — ED Provider Triage Note (Signed)
Emergency Medicine Provider Triage Evaluation Note  Paige Parker , a 86 y.o. female  was evaluated in triage.  Pt complains of shortness of breath, decreased activity, decreased eating and drinking.  Symptoms have been ongoing for the last 4 to 7 days.  No reported fevers, chest pain, Donnell pain.  History of COPD, AAA.Marland Kitchen  Review of Systems  Positive: Weakness, shortness of breath, decreased appetite Negative: Fevers, chest pain, emesis, diarrhea, urinary changes  Physical Exam  BP (!) 105/94 (BP Location: Right Arm)   Pulse 87   Temp 98.1 F (36.7 C) (Oral)   Resp 18   Ht 5\' 10"  (1.778 m)   Wt 72.6 kg   SpO2 99%   BMI 22.96 kg/m  Gen:   Awake, no distress   Resp:  Normal effort  MSK:   Moves extremities without difficulty  Other:    Medical Decision Making  Medically screening exam initiated at 3:10 PM.  Appropriate orders placed.  was informed that the remainder of the evaluation will be completed by another provider, this initial triage assessment does not replace that evaluation, and the importance of remaining in the ED until their evaluation is complete.  Patient will have chest x-ray, EKG, labs, urinalysis   Matt Holmes, PA-C 07/30/22 1514

## 2022-07-30 NOTE — ED Triage Notes (Signed)
Patient to ED for SOB for the past couple of days. Patient is not getting up and walking like normal or eating/drinking. Hx of dementia- daughter giving history in triage.

## 2022-07-30 NOTE — ED Provider Notes (Signed)
[facility] Provider Note    Event Date/Time   First MD Initiated Contact with Patient 07/30/22 2003     (approximate)   History   Chief Complaint: Shortness of Breath   HPI  Paige Parker is a 86 y.o. female with a history of COPD, dementia, aortic aneurysm who is brought to the ED due to dyspnea on exertion for the past few days along with fatigue.  She also reports loss of appetite and decreased oral intake.  No chest pain back pain abdominal pain dizziness or syncope.  Stools have been darker recently.  No blood thinners.     Physical Exam   Triage Vital Signs: ED Triage Vitals [07/30/22 1509]  Enc Vitals Group     BP (!) 105/94     Pulse Rate 87     Resp 18     Temp 98.1 F (36.7 C)     Temp Source Oral     SpO2 99 %     Weight 160 lb (72.6 kg)     Height 5\' 10"  (1.778 m)     Head Circumference      Peak Flow      Pain Score 0     Pain Loc      Pain Edu?      Excl. in GC?     Most recent vital signs: Vitals:   07/30/22 1509 07/30/22 1614  BP: (!) 105/94 108/72  Pulse: 87 86  Resp: 18 18  Temp: 98.1 F (36.7 C) 98 F (36.7 C)  SpO2: 99% 98%    General: Awake, no distress.  Appears pale CV:  Good peripheral perfusion.  Regular rate and rhythm symmetric distal pulses Resp:  Normal effort.  Clear to auscultation bilaterally Abd:  No distention.  Soft and nontender.  Rectal exam performed with daughter at bedside, melanotic stool, strongly Hemoccult positive Other:  Moist oral mucosa.   ED Results / Procedures / Treatments   Labs (all labs ordered are listed, but only abnormal results are displayed) Labs Reviewed  COMPREHENSIVE METABOLIC PANEL - Abnormal; Notable for the following components:      Result Value   Potassium 3.2 (*)    Calcium 8.4 (*)    Albumin 2.8 (*)    All other components within normal limits  CBC WITH DIFFERENTIAL/PLATELET - Abnormal; Notable for the following components:   WBC 13.3 (*)    RBC 2.42  (*)    Hemoglobin 5.6 (*)    HCT 19.3 (*)    MCV 79.8 (*)    MCH 23.1 (*)    MCHC 29.0 (*)    RDW 17.3 (*)    Platelets 559 (*)    nRBC 1.0 (*)    Neutro Abs 10.4 (*)    Abs Immature Granulocytes 0.92 (*)    All other components within normal limits  URINALYSIS, ROUTINE W REFLEX MICROSCOPIC  PROTIME-INR  APTT  PREPARE RBC (CROSSMATCH)  TYPE AND SCREEN  TROPONIN I (HIGH SENSITIVITY)  TROPONIN I (HIGH SENSITIVITY)     EKG Interpreted by me Sinus rhythm, rate of 90.  Normal axis, normal intervals.  Poor R wave progression.  Normal ST segments and T waves.  No ischemic changes.   RADIOLOGY Chest x-ray interpreted by me, negative for consolidation effusion edema or pneumothorax.  Radiology report reviewed.   PROCEDURES:  .Critical Care  Performed by: 08/01/22, MD Authorized by: Sharman Cheek, MD   Critical care provider statement:  Critical care time (minutes):  35   Critical care time was exclusive of:  Separately billable procedures and treating other patients   Critical care was necessary to treat or prevent imminent or life-threatening deterioration of the following conditions:  Circulatory failure and shock   Critical care was time spent personally by me on the following activities:  Development of treatment plan with patient or surrogate, discussions with consultants, evaluation of patient's response to treatment, examination of patient, obtaining history from patient or surrogate, ordering and performing treatments and interventions, ordering and review of laboratory studies, ordering and review of radiographic studies, pulse oximetry, re-evaluation of patient's condition and review of old charts   Care discussed with: admitting provider   Comments:           MEDICATIONS ORDERED IN ED: Medications  pantoprazole (PROTONIX) injection 40 mg (has no administration in time range)  0.9 %  sodium chloride infusion (has no administration in time range)      IMPRESSION / MDM / ASSESSMENT AND PLAN / ED COURSE  I reviewed the triage vital signs and the nursing notes.                              Differential diagnosis includes, but is not limited to, GI bleed, symptomatic anemia, AKI, electrolyte abnormality, non-STEMI  Patient's presentation is most consistent with acute presentation with potential threat to life or bodily function.  Patient presents with exertional symptoms, melanotic stool concerning for upper GI bleed.  Labs reveal essentially normal chemistry panel, hemoglobin of 5 which is acutely decreased from a baseline of 10 on labs performed April 07, 2022.  She is not on anticoagulation.  IV Protonix and RBC transfusion ordered.  Case discussed with hospitalist for further management.  Mental status is normal, blood pressure is normal, no signs of shock currently.       FINAL CLINICAL IMPRESSION(S) / ED DIAGNOSES   Final diagnoses:  Acute blood loss anemia  Upper GI bleed     Rx / DC Orders   ED Discharge Orders     None        Note:  This document was prepared using Dragon voice recognition software and may include unintentional dictation errors.   Sharman Cheek, MD 07/30/22 2207

## 2022-07-30 NOTE — H&P (Addendum)
History and Physical    Paige Parker:[id number] DOB: 1936/10/30 DOA: 07/30/2022  PCP: Barbette Reichmann, MD  Patient coming from: [facility] clinic  I have personally briefly reviewed patient's old medical records in [facility]  Chief Complaint: doe and fatigue x 2-3 days with decrease appetite  HPI: Paige Parker is a 86 y.o. female with medical history significant of Dementia, Aortic aneurysm, COPD,  who presents with doe,fatigue, generalized weakness,decrease appetite,  x 2 days with one episode of loose dark stools. Of note patient has dementia and is poor historian. History taken from chart and grandson.  Patient current denies chest pain,nausea or emesis, f/c/cough or dysuria.    ED Course:  In ED patient Vitals:afeb,  bp 105/94, hr 87, rr 18  sat 99% on ra  Patient also noted to have hgb 5.6down from 10 with rectal noting melanotic stools that were heme + .  Lab: Wbc 13.3, hgb 5.6 plt 559  increase pmn with left shift  Na 137, K 3.2 , cr 0.61 Ce 7  Inr 1.2  Cxr NAD,Emphysema Tx protonix ,prbc  Review of Systems: As per HPI otherwise 10 point review of systems negative.   Past Medical History:  Diagnosis Date   Aortic aneurysm (HCC)    COPD (chronic obstructive pulmonary disease) (HCC)    Dementia (HCC)    Heart murmur     Past Surgical History:  Procedure Laterality Date   FRACTURE SURGERY       reports that she has been smoking cigarettes. She has a 63.00 pack-year smoking history. She does not have any smokeless tobacco history on file. No history on file for alcohol use and drug use.  No Known Allergies  No family history on file.  Prior to Admission medications   Not on File    Physical Exam: Vitals:   07/30/22 1509 07/30/22 1614 07/30/22 2005 07/30/22 2027  BP: (!) 105/94 108/72 113/75   Pulse: 87 86 84   Resp: 18 18 (!) 24   Temp: 98.1 F (36.7 C) 98 F (36.7 C)  98 F (36.7 C)  TempSrc: Oral   Oral  SpO2: 99% 98% 100%   Weight: 72.6 kg      Height: 5\' 10"  (1.778 m)        Vitals:   07/30/22 1509 07/30/22 1614 07/30/22 2005 07/30/22 2027  BP: (!) 105/94 108/72 113/75   Pulse: 87 86 84   Resp: 18 18 (!) 24   Temp: 98.1 F (36.7 C) 98 F (36.7 C)  98 F (36.7 C)  TempSrc: Oral   Oral  SpO2: 99% 98% 100%   Weight: 72.6 kg     Height: 5\' 10"  (1.778 m)     Constitutional: NAD, calm, comfortable Eyes: PERRL, lids and conjunctivae normal ENMT: Mucous membranes are moist. Posterior pharynx clear of any exudate or lesions.Normal dentition.  Neck: normal, supple, no masses, no thyromegaly Respiratory: clear to auscultation bilaterally, no wheezing, no crackles. Normal respiratory effort. No accessory muscle use.  Cardiovascular: Regular rate and rhythm, no murmurs / rubs / gallops. No extremity edema. + pedal pulses. No carotid bruits.  Abdomen: no tenderness, no masses palpated. No hepatosplenomegaly. Bowel sounds positive.  Musculoskeletal: no clubbing / cyanosis. No joint deformity upper and lower extremities. Good ROM, no contractures. Normal muscle tone.  Skin: no rashes, lesions, ulcers. No induration Neurologic: CN 2-12 grossly intact. Sensation intact, . Strength 5/5 in all 4.  Psychiatric: Normal judgment and insight. Alert and  oriented x 3. Normal mood.    Labs on Admission: I have personally reviewed following labs and imaging studies  CBC: Recent Labs  Lab 07/30/22 1511  WBC 13.3*  NEUTROABS 10.4*  HGB 5.6*  HCT 19.3*  MCV 79.8*  PLT 559*   Basic Metabolic Panel: Recent Labs  Lab 07/30/22 1511  NA 137  K 3.2*  CL 101  CO2 25  GLUCOSE 84  BUN 17  CREATININE 0.61  CALCIUM 8.4*   GFR: Estimated Creatinine Clearance: 57.6 mL/min (by C-G formula based on SCr of 0.61 mg/dL). Liver Function Tests: Recent Labs  Lab 07/30/22 1511  AST 19  ALT 14  ALKPHOS 66  BILITOT 0.7  PROT 6.6  ALBUMIN 2.8*   No results for input(s): "LIPASE", "AMYLASE" in the last 168 hours. No results for input(s):  "AMMONIA" in the last 168 hours. Coagulation Profile: No results for input(s): "INR", "PROTIME" in the last 168 hours. Cardiac Enzymes: No results for input(s): "CKTOTAL", "CKMB", "CKMBINDEX", "TROPONINI" in the last 168 hours. BNP (last 3 results) No results for input(s): "PROBNP" in the last 8760 hours. HbA1C: No results for input(s): "HGBA1C" in the last 72 hours. CBG: No results for input(s): "GLUCAP" in the last 168 hours. Lipid Profile: No results for input(s): "CHOL", "HDL", "LDLCALC", "TRIG", "CHOLHDL", "LDLDIRECT" in the last 72 hours. Thyroid Function Tests: No results for input(s): "TSH", "T4TOTAL", "FREET4", "T3FREE", "THYROIDAB" in the last 72 hours. Anemia Panel: No results for input(s): "VITAMINB12", "FOLATE", "FERRITIN", "TIBC", "IRON", "RETICCTPCT" in the last 72 hours. Urine analysis: No results found for: "COLORURINE", "APPEARANCEUR", "LABSPEC", "PHURINE", "GLUCOSEU", "HGBUR", "BILIRUBINUR", "KETONESUR", "PROTEINUR", "UROBILINOGEN", "NITRITE", "LEUKOCYTESUR"  Radiological Exams on Admission: DG Chest 2 View  Result Date: 07/30/2022 CLINICAL DATA:  Shortness of breath. EXAM: CHEST - 2 VIEW COMPARISON:  CT chest 04/22/2022 FINDINGS: The lungs are hyperinflated compatible with emphysema as seen on recent CT. There is no focal lung infiltrate, pleural effusion or pneumothorax. Cardiomediastinal silhouette is within normal limits. No acute fractures are seen. IMPRESSION: 1. No acute cardiopulmonary process. 2.  Emphysema (ICD10-J43.9). Electronically Signed   By: Darliss Cheney M.D.   On: 07/30/2022 15:41    EKG: Independently reviewed. N/a  Assessment/Plan  GI bleed nos  Acute Anemia of blood loss symptomatic  -admit to progressive care  - place on protonix  - s/p prbc in ED -cycle h/h , transfuse for 7 or less  - gi consult in am  -npo currently  -supportive gentle ivfs   Gastroenteritis nos  -hx of diarrhea -Leukocytosis ,left shift  - order stool panel   -check inflammatory markers    UA -+wbc,le -ctx  -f/u urine culture , de-escalate abx as able   Dementia   Hypokalemia -replete prn    COPD  -no acute exacerbation  -continue home regimen    DVT prophylaxis: scd Code Status: full Family Communication: grandson at bedside Disposition Plan: patient  expected to be admitted greater than 2 midnights  Consults called:  GI wohl Admission status: sdu   Lurline Del MD [facility]   If 7PM-7AM, please contact night-coverage [email or web address] Password [facility]  07/30/2022, 8:52 PM

## 2022-07-31 ENCOUNTER — Encounter
Admission: EM | Disposition: A | Discharge status: Home / Self Care | Payer: Self-pay | Source: Ambulatory Visit | Attending: Obstetrics and Gynecology

## 2022-07-31 ENCOUNTER — Encounter: Payer: Self-pay | Admitting: Internal Medicine

## 2022-07-31 ENCOUNTER — Inpatient Hospital Stay: Payer: Medicare Other | Admitting: Anesthesiology

## 2022-07-31 DIAGNOSIS — D62 Acute posthemorrhagic anemia: Secondary | ICD-10-CM

## 2022-07-31 DIAGNOSIS — K921 Melena: Secondary | ICD-10-CM | POA: Diagnosis not present

## 2022-07-31 HISTORY — PX: ESOPHAGOGASTRODUODENOSCOPY (EGD) WITH PROPOFOL: SHX5813

## 2022-07-31 LAB — COMPREHENSIVE METABOLIC PANEL
ALT: 13 U/L (ref 0–44)
AST: 16 U/L (ref 15–41)
Albumin: 2.6 g/dL — ABNORMAL LOW (ref 3.5–5.0)
Alkaline Phosphatase: 60 U/L (ref 38–126)
Anion gap: 11 (ref 5–15)
BUN: 15 mg/dL (ref 8–23)
CO2: 23 mmol/L (ref 22–32)
Calcium: 7.8 mg/dL — ABNORMAL LOW (ref 8.9–10.3)
Chloride: 105 mmol/L (ref 98–111)
Creatinine, Ser: 0.65 mg/dL (ref 0.44–1.00)
GFR, Estimated: >60 mL/min (ref >60)
Glucose, Bld: 66 mg/dL — ABNORMAL LOW (ref 70–99)
Potassium: 3.6 mmol/L (ref 3.5–5.1)
Sodium: 139 mmol/L (ref 135–145)
Total Bilirubin: 1.1 mg/dL (ref 0.3–1.2)
Total Protein: 6 g/dL — ABNORMAL LOW (ref 6.5–8.1)

## 2022-07-31 LAB — CBG MONITORING, ED
Glucose-Capillary: 41 mg/dL — CL (ref 70–99)
Glucose-Capillary: 118 mg/dL — ABNORMAL HIGH (ref 70–99)

## 2022-07-31 LAB — URINALYSIS, ROUTINE W REFLEX MICROSCOPIC
Bacteria, UA: NONE SEEN
Bilirubin Urine: NEGATIVE
Glucose, UA: NEGATIVE mg/dL
Hgb urine dipstick: NEGATIVE
Ketones, ur: 20 mg/dL — AB
Nitrite: NEGATIVE
Protein, ur: 30 mg/dL — AB
Specific Gravity, Urine: 1.018 (ref 1.005–1.030)
WBC, UA: >50 WBC/hpf — ABNORMAL HIGH (ref 0–5)
pH: 5 (ref 5.0–8.0)

## 2022-07-31 LAB — CBC
HCT: 25.7 % — ABNORMAL LOW (ref 36.0–46.0)
Hemoglobin: 8 g/dL — ABNORMAL LOW (ref 12.0–15.0)
MCH: 24.9 pg — ABNORMAL LOW (ref 26.0–34.0)
MCHC: 31.1 g/dL (ref 30.0–36.0)
MCV: 80.1 fL (ref 80.0–100.0)
Platelets: 446 10*3/uL — ABNORMAL HIGH (ref 150–400)
RBC: 3.21 MIL/uL — ABNORMAL LOW (ref 3.87–5.11)
RDW: 16.3 % — ABNORMAL HIGH (ref 11.5–15.5)
WBC: 13.5 10*3/uL — ABNORMAL HIGH (ref 4.0–10.5)
nRBC: 0.7 % — ABNORMAL HIGH (ref 0.0–0.2)

## 2022-07-31 LAB — HEMOGLOBIN: Hemoglobin: 9.1 g/dL — ABNORMAL LOW (ref 12.0–15.0)

## 2022-07-31 SURGERY — ESOPHAGOGASTRODUODENOSCOPY (EGD) WITH PROPOFOL
Anesthesia: General

## 2022-07-31 MED ORDER — SODIUM CHLORIDE 0.9 % IV SOLN
1.0000 g | INTRAVENOUS | Status: DC
  Administered 2022-07-31: 1 g via INTRAVENOUS
  Filled 2022-07-31: qty 10

## 2022-07-31 MED ORDER — PROPOFOL 10 MG/ML IV BOLUS
INTRAVENOUS | Status: DC | PRN
  Administered 2022-07-31: 50 mg via INTRAVENOUS
  Administered 2022-07-31: 30 mg via INTRAVENOUS
  Administered 2022-07-31: 20 mg via INTRAVENOUS

## 2022-07-31 MED ORDER — KCL IN DEXTROSE-NACL 10-5-0.45 MEQ/L-%-% IV SOLN
INTRAVENOUS | Status: DC
  Filled 2022-07-31 (×4): qty 1000

## 2022-07-31 MED ORDER — LINACLOTIDE 72 MCG PO CAPS: 72.0000 ug | ORAL_CAPSULE | Freq: Every day | ORAL | Status: DC | PRN

## 2022-07-31 MED ORDER — DEXTROSE 50 % IV SOLN
INTRAVENOUS | Status: AC
  Filled 2022-07-31: qty 50

## 2022-07-31 MED ORDER — MEMANTINE HCL 5 MG PO TABS
10.0000 mg | ORAL_TABLET | Freq: Two times a day (BID) | ORAL | Status: DC
  Administered 2022-07-31 – 2022-08-01 (×2): 10 mg via ORAL
  Filled 2022-07-31 (×2): qty 2

## 2022-07-31 MED ORDER — PROPOFOL 10 MG/ML IV BOLUS
INTRAVENOUS | Status: AC
  Filled 2022-07-31: qty 20

## 2022-07-31 MED ORDER — LEVOTHYROXINE SODIUM 112 MCG PO TABS
112.0000 ug | ORAL_TABLET | Freq: Every day | ORAL | Status: DC
  Administered 2022-08-01: 112 ug via ORAL
  Filled 2022-07-31 (×2): qty 1

## 2022-07-31 MED ORDER — DONEPEZIL HCL 5 MG PO TABS
5.0000 mg | ORAL_TABLET | Freq: Every day | ORAL | Status: DC
  Administered 2022-07-31: 5 mg via ORAL
  Filled 2022-07-31: qty 1

## 2022-07-31 MED ORDER — LIDOCAINE HCL (CARDIAC) PF 100 MG/5ML IV SOSY
PREFILLED_SYRINGE | INTRAVENOUS | Status: DC | PRN
  Administered 2022-07-31: 50 mg via INTRAVENOUS

## 2022-07-31 MED ORDER — SODIUM CHLORIDE 0.9 % IV SOLN: INTRAVENOUS | Status: DC

## 2022-07-31 NOTE — Anesthesia Preprocedure Evaluation (Addendum)
Anesthesia Evaluation  Patient identified by MRN, date of birth, ID band Patient awake    Reviewed: Allergy & Precautions, NPO status , Patient's Chart, lab work & pertinent test results  History of Anesthesia Complications Negative for: history of anesthetic complications  Airway Mallampati: III  TM Distance: <3 FB Neck ROM: full    Dental  (+) Chipped, Implants   Pulmonary COPD, Current Smoker and Patient abstained from smoking.,    Pulmonary exam normal        Cardiovascular hypertension, (-) anginaNormal cardiovascular exam     Neuro/Psych PSYCHIATRIC DISORDERS negative neurological ROS     GI/Hepatic negative GI ROS, Neg liver ROS, neg GERD  ,  Endo/Other  Hypothyroidism   Renal/GU negative Renal ROS  negative genitourinary   Musculoskeletal   Abdominal   Peds  Hematology negative hematology ROS (+)   Anesthesia Other Findings Past Medical History: No date: Aortic aneurysm (HCC) No date: COPD (chronic obstructive pulmonary disease) (HCC) No date: Dementia (HCC) No date: Heart murmur  Past Surgical History: No date: FRACTURE SURGERY  BMI    Body Mass Index: 22.96 kg/m      Reproductive/Obstetrics negative OB ROS                            Anesthesia Physical Anesthesia Plan  ASA: 3  Anesthesia Plan: General   Post-op Pain Management:    Induction: Intravenous  PONV Risk Score and Plan: Propofol infusion and TIVA  Airway Management Planned: Natural Airway and Nasal Cannula  Additional Equipment:   Intra-op Plan:   Post-operative Plan:   Informed Consent: I have reviewed the patients History and Physical, chart, labs and discussed the procedure including the risks, benefits and alternatives for the proposed anesthesia with the patient or authorized representative who has indicated his/her understanding and acceptance.     Dental Advisory Given  Plan  Discussed with: Anesthesiologist, CRNA and Surgeon  Anesthesia Plan Comments: (History and phone consent from the patients daughter Johny Shock at [phone]   Daughter consented for risks of anesthesia including but not limited to:  - adverse reactions to medications - risk of airway placement if required - damage to eyes, teeth, lips or other oral mucosa - nerve damage due to positioning  - sore throat or hoarseness - Damage to heart, brain, nerves, lungs, other parts of body or loss of life  She voiced understanding.)      Anesthesia Quick Evaluation

## 2022-07-31 NOTE — Anesthesia Postprocedure Evaluation (Signed)
Anesthesia Post Note  Patient: Paige Parker  Procedure(s) Performed: ESOPHAGOGASTRODUODENOSCOPY (EGD) WITH PROPOFOL  Patient location during evaluation: Endoscopy Anesthesia Type: General Level of consciousness: awake and alert Pain management: pain level controlled Vital Signs Assessment: post-procedure vital signs reviewed and stable Respiratory status: spontaneous breathing, nonlabored ventilation, respiratory function stable and patient connected to nasal cannula oxygen Cardiovascular status: blood pressure returned to baseline and stable Postop Assessment: no apparent nausea or vomiting Anesthetic complications: no   No notable events documented.   Last Vitals:  Vitals:   07/31/22 1440 07/31/22 1444  BP: 112/65 112/65  Pulse: 75   Resp: (!) 21 (!) 29  Temp: 36.6 C   SpO2: 99%     Last Pain:  Vitals:   07/31/22 1440  TempSrc: Temporal  PainSc: 0-No pain                 Cleda Mccreedy Marcile Fuquay

## 2022-07-31 NOTE — Progress Notes (Signed)
PROGRESS NOTE    Paige Parker  QZE:[id number] DOB: [birth year] DOA: 07/30/2022 PCP: Barbette Reichmann, MD      Brief Narrative:   Presenting with several days fatigue and dyspnea on exertion, found to have hemoglobin of 5.6 from baseline of around 10   Assessment & Plan:   Principal Problem:   GI bleed Active Problems:   Acquired hypothyroidism   Ascending aortic aneurysm (HCC)   COPD, moderate (HCC)   Essential hypertension   Dementia without behavioral disturbance (HCC)   # Anemia, microcytic Suspect 2/2 GI loss. Takes aleve couple of times a week, otherwise no nsaids. History tobacco abuse and heavy drinking, none within the last year or so. Hemodynamically stable. S/p 2 units with appropriate rise (5.6>8) - continue PPI - GI to see - trend hgb, transfuse to maintain above 7 - npo for now  # Hypoglycemia This morning in setting of npo. D50 given. Asymptoamtic - monitor - add glucose to fluids  # Hypothyroid - home synthroid  # COPD Quiescent, not on home inhalers  # HTN Here bp wnl in setting of possible bleeding - hold home verapamil and losartan and atorvastatin for now  # Dementia Appears to be moderate. Lives alone at [place] with family support - cont home memantine and donepezil - pt/ot consult when stabilized  DVT prophylaxis: SCDs Code Status: full Family Communication: daughter updated @ bedside 8/18  Level of care: Progressive Status is: Inpatient Remains inpatient appropriate because: severity of illness    Consultants:  GI  Procedures: pending  Antimicrobials:  none    Subjective: No pain, no sob at rest  Objective: Vitals:   07/31/22 0315 07/31/22 0348 07/31/22 0530 07/31/22 0655  BP:  135/76 (!) 142/88 123/78  Pulse:  81 85 81  Resp:  14 16 16   Temp:  (!) 97.5 F (36.4 C)    TempSrc:  Oral    SpO2: 100% 100% 99% 100%  Weight:      Height:        Intake/Output Summary (Last 24 hours) at 07/31/2022 0827 Last  data filed at 07/31/2022 0348 Gross per 24 hour  Intake 640 ml  Output --  Net 640 ml   Filed Weights   07/30/22 1509  Weight: 72.6 kg    Examination:  General exam: Appears calm and comfortable  Respiratory system: Clear to auscultation save for faint railes at bases Cardiovascular system: S1 & S2 heard, RRR. No JVD, murmurs, rubs, gallops or clicks. No pedal edema. Gastrointestinal system: Abdomen is nondistended, soft and nontender. No organomegaly or masses felt. Normal bowel sounds heard. Central nervous system: Alert and oriented. No focal neurological deficits. Hard of hearing Extremities: Symmetric 5 x 5 power. Skin: No rashes, lesions or ulcers Psychiatry: bit confused    Data Reviewed: I have personally reviewed following labs and imaging studies  CBC: Recent Labs  Lab 07/30/22 1511 07/31/22 0528  WBC 13.3* 13.5*  NEUTROABS 10.4*  --   HGB 5.6* 8.0*  HCT 19.3* 25.7*  MCV 79.8* 80.1  PLT 559* 446*   Basic Metabolic Panel: Recent Labs  Lab 07/30/22 1511 07/31/22 0528  NA 137 139  K 3.2* 3.6  CL 101 105  CO2 25 23  GLUCOSE 84 66*  BUN 17 15  CREATININE 0.61 0.65  CALCIUM 8.4* 7.8*   GFR: Estimated Creatinine Clearance: 57.6 mL/min (by C-G formula based on SCr of 0.65 mg/dL). Liver Function Tests: Recent Labs  Lab 07/30/22 1511 07/31/22 0528  AST 19 16  ALT 14 13  ALKPHOS 66 60  BILITOT 0.7 1.1  PROT 6.6 6.0*  ALBUMIN 2.8* 2.6*   No results for input(s): "LIPASE", "AMYLASE" in the last 168 hours. No results for input(s): "AMMONIA" in the last 168 hours. Coagulation Profile: Recent Labs  Lab 07/30/22 2027  INR 1.2   Cardiac Enzymes: No results for input(s): "CKTOTAL", "CKMB", "CKMBINDEX", "TROPONINI" in the last 168 hours. BNP (last 3 results) No results for input(s): "PROBNP" in the last 8760 hours. HbA1C: No results for input(s): "HGBA1C" in the last 72 hours. CBG: Recent Labs  Lab 07/31/22 0802  GLUCAP 41*   Lipid  Profile: No results for input(s): "CHOL", "HDL", "LDLCALC", "TRIG", "CHOLHDL", "LDLDIRECT" in the last 72 hours. Thyroid Function Tests: No results for input(s): "TSH", "T4TOTAL", "FREET4", "T3FREE", "THYROIDAB" in the last 72 hours. Anemia Panel: No results for input(s): "VITAMINB12", "FOLATE", "FERRITIN", "TIBC", "IRON", "RETICCTPCT" in the last 72 hours. Urine analysis:    Component Value Date/Time   COLORURINE YELLOW (A) 07/31/2022 0014   APPEARANCEUR HAZY (A) 07/31/2022 0014   LABSPEC 1.018 07/31/2022 0014   PHURINE 5.0 07/31/2022 0014   GLUCOSEU NEGATIVE 07/31/2022 0014   HGBUR NEGATIVE 07/31/2022 0014   BILIRUBINUR NEGATIVE 07/31/2022 0014   KETONESUR 20 (A) 07/31/2022 0014   PROTEINUR 30 (A) 07/31/2022 0014   NITRITE NEGATIVE 07/31/2022 0014   LEUKOCYTESUR MODERATE (A) 07/31/2022 0014   Sepsis Labs: @LABRCNTIP (procalcitonin:4,lacticidven:4)  )No results found for this or any previous visit (from the past 240 hour(s)).       Radiology Studies: DG Chest 2 View  Result Date: 07/30/2022 CLINICAL DATA:  Shortness of breath. EXAM: CHEST - 2 VIEW COMPARISON:  CT chest 04/22/2022 FINDINGS: The lungs are hyperinflated compatible with emphysema as seen on recent CT. There is no focal lung infiltrate, pleural effusion or pneumothorax. Cardiomediastinal silhouette is within normal limits. No acute fractures are seen. IMPRESSION: 1. No acute cardiopulmonary process. 2.  Emphysema (ICD10-J43.9). Electronically Signed   By: 06/22/2022 M.D.   On: 07/30/2022 15:41        Scheduled Meds:  pantoprazole (PROTONIX) IV  40 mg Intravenous Q12H   Continuous Infusions:  sodium chloride Stopped (07/30/22 2059)   sodium chloride 50 mL/hr at 07/30/22 2244   cefTRIAXone (ROCEPHIN)  IV Stopped (07/31/22 0430)     LOS: 1 day     08/02/22, MD [facility]   If 7PM-7AM, please contact night-coverage [email or web address] Password [facility] 07/31/2022, 8:27 AM

## 2022-07-31 NOTE — Op Note (Signed)
[facility] Gastroenterology Patient Name: Paige Parker Procedure Date: 07/31/2022 2:06 PM MRN: [id number] Account #: [id number] Date of Birth: 06/21/1936 Admit Type: Inpatient Age: 86 Room: [facility] ENDO ROOM 1 Gender: Female Note Status: Finalized Instrument Name: Upper Endoscope [id number] Procedure:             Upper GI endoscopy Indications:           Melena Providers:             Midge Minium MD, MD Referring MD:          Barbette Reichmann, MD (Referring MD) Medicines:             Propofol per Anesthesia Complications:         No immediate complications. Procedure:             Pre-Anesthesia Assessment:                        - Prior to the procedure, a History and Physical was                         performed, and patient medications and allergies were                         reviewed. The patient's tolerance of previous                         anesthesia was also reviewed. The risks and benefits                         of the procedure and the sedation options and risks                         were discussed with the patient. All questions were                         answered, and informed consent was obtained. Prior                         Anticoagulants: The patient has taken no previous                         anticoagulant or antiplatelet agents. ASA Grade                         Assessment: II - A patient with mild systemic disease.                         After reviewing the risks and benefits, the patient                         was deemed in satisfactory condition to undergo the                         procedure.                        After obtaining informed consent, the endoscope was  passed under direct vision. Throughout the procedure,                         the patient's blood pressure, pulse, and oxygen                         saturations were monitored continuously. The Endoscope                         was introduced  through the mouth, and advanced to the                         third part of duodenum. The upper GI endoscopy was                         accomplished without difficulty. The patient tolerated                         the procedure well. Findings:      The examined esophagus was normal.      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      A single angiodysplastic lesion with bleeding was found in the gastric       fundus. Coagulation for hemostasis using argon plasma at 2 liters/minute       and 30 watts was successful.      The examined duodenum was normal. Impression:            - Normal esophagus.                        - Small paraesophageal hernia.                        - A single bleeding angiodysplastic lesion in the                         stomach. Treated with argon plasma coagulation (APC).                        - Normal examined duodenum.                        - No specimens collected. Recommendation:        - Return patient to hospital ward for ongoing care.                        - Resume previous diet.                        - Continue present medications. Procedure Code(s):     --- Professional ---                        [phone], Esophagogastroduodenoscopy, flexible,                         transoral; with control of bleeding, any method Diagnosis Code(s):     --- Professional ---                        K92.1, Melena (includes Hematochezia)  K31.811, Angiodysplasia of stomach and duodenum with                         bleeding                        K44.9, Diaphragmatic hernia without obstruction or                         gangrene CPT copyright 2019 [employer] Medical Association. All rights reserved. The codes documented in this report are preliminary and upon coder review may  be revised to meet current compliance requirements. Midge Minium MD, MD 07/31/2022 2:36:35 PM This report has been signed electronically. Number of Addenda: 0 Note Initiated  On: 07/31/2022 2:06 PM Estimated Blood Loss:  Estimated blood loss: none.      [facility]

## 2022-07-31 NOTE — Plan of Care (Addendum)
AM CBG was 41 - EMT pushing D50.  Dr. Informed and aware.  Will reassess. NPO for surgery  - DR. Will is planning to change fluids to assist 0900 Recheck CBG 118

## 2022-07-31 NOTE — ED Notes (Signed)
The pt was administered 1 amp of D50 via IV push due to a blood sugar of 41.

## 2022-07-31 NOTE — Transfer of Care (Signed)
Immediate Anesthesia Transfer of Care Note  Patient: Paige Parker  Procedure(s) Performed: ESOPHAGOGASTRODUODENOSCOPY (EGD) WITH PROPOFOL  Patient Location: Endoscopy Unit  Anesthesia Type:General  Level of Consciousness: awake  Airway & Oxygen Therapy: Patient Spontanous Breathing  Post-op Assessment: Report given to RN and Post -op Vital signs reviewed and stable  Post vital signs: Reviewed and stable  Last Vitals:  Vitals Value Taken Time  BP 112/65 07/31/22 1444  Temp 36.6 C 07/31/22 1440  Pulse 75 07/31/22 1440  Resp 29 07/31/22 1444  SpO2 99 % 07/31/22 1440    Last Pain:  Vitals:   07/31/22 1440  TempSrc: Temporal  PainSc: 0-No pain         Complications: No notable events documented.

## 2022-07-31 NOTE — ED Notes (Signed)
Blood paused and restarted in 22g left wrist PIV after PIV start.

## 2022-08-01 DIAGNOSIS — K31811 Angiodysplasia of stomach and duodenum with bleeding: Secondary | ICD-10-CM | POA: Diagnosis not present

## 2022-08-01 DIAGNOSIS — K31819 Angiodysplasia of stomach and duodenum without bleeding: Secondary | ICD-10-CM

## 2022-08-01 LAB — TYPE AND SCREEN
ABO/RH(D): A POS
Antibody Screen: NEGATIVE
Unit division: 0
Unit division: 0

## 2022-08-01 LAB — BASIC METABOLIC PANEL
Anion gap: 6 (ref 5–15)
BUN: 6 mg/dL — ABNORMAL LOW (ref 8–23)
CO2: 27 mmol/L (ref 22–32)
Calcium: 8 mg/dL — ABNORMAL LOW (ref 8.9–10.3)
Chloride: 105 mmol/L (ref 98–111)
Creatinine, Ser: 0.5 mg/dL (ref 0.44–1.00)
GFR, Estimated: >60 mL/min (ref >60)
Glucose, Bld: 114 mg/dL — ABNORMAL HIGH (ref 70–99)
Potassium: 3.3 mmol/L — ABNORMAL LOW (ref 3.5–5.1)
Sodium: 138 mmol/L (ref 135–145)

## 2022-08-01 LAB — URINE CULTURE: Culture: 10000 — AB

## 2022-08-01 LAB — CBC
HCT: 26.1 % — ABNORMAL LOW (ref 36.0–46.0)
Hemoglobin: 8.1 g/dL — ABNORMAL LOW (ref 12.0–15.0)
MCH: 24.7 pg — ABNORMAL LOW (ref 26.0–34.0)
MCHC: 31 g/dL (ref 30.0–36.0)
MCV: 79.6 fL — ABNORMAL LOW (ref 80.0–100.0)
Platelets: 496 10*3/uL — ABNORMAL HIGH (ref 150–400)
RBC: 3.28 MIL/uL — ABNORMAL LOW (ref 3.87–5.11)
RDW: 17 % — ABNORMAL HIGH (ref 11.5–15.5)
WBC: 12.4 10*3/uL — ABNORMAL HIGH (ref 4.0–10.5)
nRBC: 0.3 % — ABNORMAL HIGH (ref 0.0–0.2)

## 2022-08-01 LAB — BPAM RBC
Blood Product Expiration Date: 202309062359
Blood Product Expiration Date: 202309062359
ISSUE DATE / TIME: 202308172303
ISSUE DATE / TIME: 202308180131
Unit Type and Rh: 6200
Unit Type and Rh: 6200

## 2022-08-01 LAB — HEMOGLOBIN: Hemoglobin: 8 g/dL — ABNORMAL LOW (ref 12.0–15.0)

## 2022-08-01 LAB — MAGNESIUM: Magnesium: 1.7 mg/dL (ref 1.7–2.4)

## 2022-08-01 MED ORDER — POTASSIUM CHLORIDE CRYS ER 20 MEQ PO TBCR
60.0000 meq | EXTENDED_RELEASE_TABLET | Freq: Once | ORAL | Status: AC
  Administered 2022-08-01: 60 meq via ORAL
  Filled 2022-08-01: qty 3

## 2022-08-01 MED ORDER — LINZESS 72 MCG PO CAPS: 72.0000 ug | ORAL_CAPSULE | Freq: Every day | ORAL | Status: AC | PRN

## 2022-08-01 NOTE — Plan of Care (Signed)

## 2022-08-01 NOTE — Progress Notes (Signed)
Patient discharged to home with all belongings. PIVX2 removed with no complications.

## 2022-08-01 NOTE — Plan of Care (Signed)
  Problem: Education: Goal: Knowledge of General Education information will improve Description: Including pain rating scale, medication(s)/side effects and non-pharmacologic comfort measures 08/01/2022 1440 by Evelena Peat, RN Outcome: Completed/Met 08/01/2022 0933 by Evelena Peat, RN Outcome: Progressing   Problem: Health Behavior/Discharge Planning: Goal: Ability to manage health-related needs will improve 08/01/2022 1440 by Evelena Peat, RN Outcome: Completed/Met 08/01/2022 0933 by Evelena Peat, RN Outcome: Progressing   Problem: Clinical Measurements: Goal: Ability to maintain clinical measurements within normal limits will improve 08/01/2022 1440 by Evelena Peat, RN Outcome: Completed/Met 08/01/2022 0933 by Evelena Peat, RN Outcome: Progressing Goal: Will remain free from infection 08/01/2022 1440 by Evelena Peat, RN Outcome: Completed/Met 08/01/2022 0933 by Evelena Peat, RN Outcome: Progressing Goal: Diagnostic test results will improve 08/01/2022 1440 by Evelena Peat, RN Outcome: Completed/Met 08/01/2022 0933 by Evelena Peat, RN Outcome: Progressing Goal: Respiratory complications will improve 08/01/2022 1440 by Evelena Peat, RN Outcome: Completed/Met 08/01/2022 0933 by Evelena Peat, RN Outcome: Progressing Goal: Cardiovascular complication will be avoided 08/01/2022 1440 by Evelena Peat, RN Outcome: Completed/Met 08/01/2022 0933 by Evelena Peat, RN Outcome: Progressing

## 2022-08-01 NOTE — Discharge Summary (Signed)
Paige Parker DGL:[id number] DOB: June 10, 1936 DOA: 07/30/2022  PCP: Barbette Reichmann, MD  Admit date: 07/30/2022 Discharge date: 08/01/2022  Time spent: 35 minutes  Recommendations for Outpatient Follow-up:  Pcp f/u 1-2 weeks, check hemoglobin then     Discharge Diagnoses:  Principal Problem:   GI bleed Active Problems:   Acquired hypothyroidism   Ascending aortic aneurysm (HCC)   COPD, moderate (HCC)   Essential hypertension   Dementia without behavioral disturbance (HCC)   Acute blood loss anemia   Discharge Condition: stable  Diet recommendation: heart healthy  Filed Weights   07/30/22 1509 08/01/22 0500  Weight: 72.6 kg 73 kg    History of present illness:  From admission h and p Paige Parker is a 86 y.o. female with medical history significant of Dementia, Aortic aneurysm, COPD,  who presents with doe,fatigue, generalized weakness,decrease appetite,  x 2 days with one episode of loose dark stools. Of note patient has dementia and is poor historian. History taken from chart and grandson.  Patient current denies chest pain,nausea or emesis, f/c/cough or dysuria.   Hospital Course:  Presented with symptomatic anemia, hgb 5.6 from baseline around 10. Transfused 2 units and hgb after that stable at 8. No hematochezia or melena here. EGD on 8/18 found a single bleeding angiodysplastic lesion in the stomach which was treated with argon plasma coagulation. Plan will be discharge home with pcp f/u in 1-2 weeks for re-check of hemoglobin. Bleeding precautions given. Will continue home oral iron.   Procedures: See above   Consultations: GI  Discharge Exam: Vitals:   08/01/22 0815 08/01/22 1224  BP: (!) 147/88 (!) 154/90  Pulse: 78 73  Resp: 17 18  Temp: (!) 97.4 F (36.3 C) 97.8 F (36.6 C)  SpO2: 96% 100%    General exam: Appears calm and comfortable  Respiratory system: Clear to auscultation save for faint railes at bases Cardiovascular system: S1 & S2 heard, RRR.  No JVD, murmurs, rubs, gallops or clicks. No pedal edema. Gastrointestinal system: Abdomen is nondistended, soft and nontender. No organomegaly or masses felt. Normal bowel sounds heard. Central nervous system: Alert and oriented. No focal neurological deficits. Hard of hearing Extremities: Symmetric 5 x 5 power. Skin: No rashes, lesions or ulcers Psychiatry: bit confused  Discharge Instructions   Discharge Instructions     Diet - low sodium heart healthy   Complete by: As directed    Increase activity slowly   Complete by: As directed       Allergies as of 08/01/2022   No Known Allergies      Medication List     TAKE these medications    atorvastatin 10 MG tablet Commonly known as: LIPITOR Take 10 mg by mouth daily.   donepezil 5 MG tablet Commonly known as: ARICEPT Take 5 mg by mouth at bedtime.   ferrous sulfate 325 (65 FE) MG tablet Take 325 mg by mouth daily.   folic acid 400 MCG tablet Commonly known as: FOLVITE Take 400 mcg by mouth daily.   levothyroxine 112 MCG tablet Commonly known as: SYNTHROID TAKE 1 TABLET (112 MCG TOTAL) BY MOUTH ONCE DAILY FOR 180 DAYS TAKE ON AN EMPTY STOMACH WITH A GLASS OF WATER AT LEAST 30-60 MINUTES BEFORE BREAKFAST.   Linzess 72 MCG capsule Generic drug: linaclotide Take 1 capsule (72 mcg total) by mouth daily as needed.   losartan 25 MG tablet Commonly known as: COZAAR Take 25 mg by mouth daily.   memantine 10 MG tablet  Commonly known as: NAMENDA Take 10 mg by mouth 2 (two) times daily.   verapamil 40 MG tablet Commonly known as: CALAN Take 40 mg by mouth 2 (two) times daily.   Vitamin B12 500 MCG Tabs Take 500 mcg by mouth daily in the afternoon.       No Known Allergies  Follow-up Information     Barbette Reichmann, MD Follow up.   Specialty: Internal Medicine Why: 1-2 weeks Contact information: [address] [place] [address] [phone]                   The results of significant diagnostics from this hospitalization (including imaging, microbiology, ancillary and laboratory) are listed below for reference.    Significant Diagnostic Studies: DG Chest 2 View  Result Date: 07/30/2022 CLINICAL DATA:  Shortness of breath. EXAM: CHEST - 2 VIEW COMPARISON:  CT chest 04/22/2022 FINDINGS: The lungs are hyperinflated compatible with emphysema as seen on recent CT. There is no focal lung infiltrate, pleural effusion or pneumothorax. Cardiomediastinal silhouette is within normal limits. No acute fractures are seen. IMPRESSION: 1. No acute cardiopulmonary process. 2.  Emphysema (ICD10-J43.9). Electronically Signed   By: Darliss Cheney M.D.   On: 07/30/2022 15:41    Microbiology: Recent Results (from the past 240 hour(s))  Urine Culture     Status: Abnormal   Collection Time: 07/31/22 12:14 AM   Specimen: Urine, Random  Result Value Ref Range Status   Specimen Description   Final    URINE, RANDOM Performed at [facility], [address] [place], [place] [address]    Special Requests   Final    NONE Performed at [facility], [address] [place], [place] [address]    Culture (A)  Final    10,000 COLONIES/mL MULTIPLE SPECIES PRESENT, SUGGEST RECOLLECTION   Report Status 08/01/2022 FINAL  Final     Labs: Basic Metabolic Panel: Recent Labs  Lab 07/30/22 1511 07/31/22 0528 08/01/22 0705  NA 137 139 138  K 3.2* 3.6 3.3*  CL 101 105 105  CO2 25 23 27   GLUCOSE 84 66* 114*  BUN 17 15 6*  CREATININE 0.61 0.65 0.50  CALCIUM 8.4* 7.8* 8.0*  MG  --   --  1.7   Liver Function Tests: Recent Labs  Lab 07/30/22 1511 07/31/22 0528  AST 19 16  ALT 14 13  ALKPHOS 66 60  BILITOT 0.7 1.1  PROT 6.6 6.0*  ALBUMIN 2.8* 2.6*   No results for input(s): "LIPASE", "AMYLASE" in the last 168 hours. No results for input(s): "AMMONIA" in the last 168 hours. CBC: Recent Labs  Lab 07/30/22 1511 07/31/22 0528  07/31/22 1617 08/01/22 0705 08/01/22 1205  WBC 13.3* 13.5*  --  12.4*  --   NEUTROABS 10.4*  --   --   --   --   HGB 5.6* 8.0* 9.1* 8.1* 8.0*  HCT 19.3* 25.7*  --  26.1*  --   MCV 79.8* 80.1  --  79.6*  --   PLT 559* 446*  --  496*  --    Cardiac Enzymes: No results for input(s): "CKTOTAL", "CKMB", "CKMBINDEX", "TROPONINI" in the last 168 hours. BNP: BNP (last 3 results) No results for input(s): "BNP" in the last 8760 hours.  ProBNP (last 3 results) No results for input(s): "PROBNP" in the last 8760 hours.  CBG: Recent Labs  Lab 07/31/22 0802 07/31/22 0856  GLUCAP 41* 118*  Signed:  Silvano Bilis MD.  [facility] 08/01/2022, 1:16 PM

## 2022-08-05 LAB — PREPARE RBC (CROSSMATCH)

## 2022-10-10 ENCOUNTER — Encounter: Admission: EM | Disposition: E | Payer: Self-pay | Source: Home / Self Care | Attending: Surgery

## 2022-10-10 ENCOUNTER — Emergency Department: Payer: Medicare Other | Admitting: Certified Registered"

## 2022-10-10 ENCOUNTER — Other Ambulatory Visit: Payer: Self-pay

## 2022-10-10 ENCOUNTER — Encounter: Payer: Self-pay | Admitting: Emergency Medicine

## 2022-10-10 ENCOUNTER — Emergency Department: Payer: Medicare Other

## 2022-10-10 ENCOUNTER — Inpatient Hospital Stay
Admission: EM | Admit: 2022-10-10 | Discharge: 2022-10-14 | DRG: 853 | Disposition: E | Payer: Medicare Other | Attending: Surgery | Admitting: Surgery

## 2022-10-10 DIAGNOSIS — Z87891 Personal history of nicotine dependence: Secondary | ICD-10-CM

## 2022-10-10 DIAGNOSIS — R198 Other specified symptoms and signs involving the digestive system and abdomen: Secondary | ICD-10-CM | POA: Diagnosis present

## 2022-10-10 DIAGNOSIS — Z66 Do not resuscitate: Secondary | ICD-10-CM | POA: Diagnosis present

## 2022-10-10 DIAGNOSIS — K668 Other specified disorders of peritoneum: Secondary | ICD-10-CM

## 2022-10-10 DIAGNOSIS — K4000 Bilateral inguinal hernia, with obstruction, without gangrene, not specified as recurrent: Secondary | ICD-10-CM | POA: Diagnosis not present

## 2022-10-10 DIAGNOSIS — J449 Chronic obstructive pulmonary disease, unspecified: Secondary | ICD-10-CM | POA: Diagnosis present

## 2022-10-10 DIAGNOSIS — Z79890 Hormone replacement therapy (postmenopausal): Secondary | ICD-10-CM

## 2022-10-10 DIAGNOSIS — I700 Atherosclerosis of aorta: Secondary | ICD-10-CM | POA: Diagnosis present

## 2022-10-10 DIAGNOSIS — A419 Sepsis, unspecified organism: Secondary | ICD-10-CM | POA: Diagnosis not present

## 2022-10-10 DIAGNOSIS — E876 Hypokalemia: Secondary | ICD-10-CM | POA: Diagnosis not present

## 2022-10-10 DIAGNOSIS — J9602 Acute respiratory failure with hypercapnia: Secondary | ICD-10-CM | POA: Diagnosis not present

## 2022-10-10 DIAGNOSIS — Z79899 Other long term (current) drug therapy: Secondary | ICD-10-CM

## 2022-10-10 DIAGNOSIS — E874 Mixed disorder of acid-base balance: Secondary | ICD-10-CM | POA: Diagnosis not present

## 2022-10-10 DIAGNOSIS — Z85828 Personal history of other malignant neoplasm of skin: Secondary | ICD-10-CM

## 2022-10-10 DIAGNOSIS — R6521 Severe sepsis with septic shock: Secondary | ICD-10-CM | POA: Diagnosis present

## 2022-10-10 DIAGNOSIS — K660 Peritoneal adhesions (postprocedural) (postinfection): Secondary | ICD-10-CM | POA: Diagnosis present

## 2022-10-10 DIAGNOSIS — K5520 Angiodysplasia of colon without hemorrhage: Secondary | ICD-10-CM | POA: Diagnosis present

## 2022-10-10 DIAGNOSIS — R197 Diarrhea, unspecified: Secondary | ICD-10-CM | POA: Diagnosis present

## 2022-10-10 DIAGNOSIS — J9601 Acute respiratory failure with hypoxia: Secondary | ICD-10-CM

## 2022-10-10 DIAGNOSIS — R100 Acute abdomen: Principal | ICD-10-CM

## 2022-10-10 DIAGNOSIS — K5730 Diverticulosis of large intestine without perforation or abscess without bleeding: Secondary | ICD-10-CM | POA: Diagnosis present

## 2022-10-10 DIAGNOSIS — R011 Cardiac murmur, unspecified: Secondary | ICD-10-CM | POA: Diagnosis present

## 2022-10-10 DIAGNOSIS — K35201 Acute appendicitis with generalized peritonitis, with perforation, without abscess: Secondary | ICD-10-CM | POA: Diagnosis present

## 2022-10-10 DIAGNOSIS — R531 Weakness: Secondary | ICD-10-CM | POA: Diagnosis not present

## 2022-10-10 DIAGNOSIS — N19 Unspecified kidney failure: Secondary | ICD-10-CM

## 2022-10-10 DIAGNOSIS — K219 Gastro-esophageal reflux disease without esophagitis: Secondary | ICD-10-CM | POA: Diagnosis present

## 2022-10-10 DIAGNOSIS — N179 Acute kidney failure, unspecified: Secondary | ICD-10-CM

## 2022-10-10 DIAGNOSIS — N170 Acute kidney failure with tubular necrosis: Secondary | ICD-10-CM | POA: Diagnosis present

## 2022-10-10 DIAGNOSIS — Z515 Encounter for palliative care: Secondary | ICD-10-CM

## 2022-10-10 DIAGNOSIS — N390 Urinary tract infection, site not specified: Secondary | ICD-10-CM | POA: Diagnosis present

## 2022-10-10 DIAGNOSIS — E039 Hypothyroidism, unspecified: Secondary | ICD-10-CM | POA: Diagnosis present

## 2022-10-10 DIAGNOSIS — I10 Essential (primary) hypertension: Secondary | ICD-10-CM | POA: Diagnosis present

## 2022-10-10 DIAGNOSIS — F0390 Unspecified dementia without behavioral disturbance: Secondary | ICD-10-CM | POA: Diagnosis present

## 2022-10-10 DIAGNOSIS — Z8679 Personal history of other diseases of the circulatory system: Secondary | ICD-10-CM

## 2022-10-10 DIAGNOSIS — E538 Deficiency of other specified B group vitamins: Secondary | ICD-10-CM | POA: Diagnosis present

## 2022-10-10 HISTORY — PX: LAPAROTOMY: SHX154

## 2022-10-10 HISTORY — PX: COLOSTOMY: SHX63

## 2022-10-10 HISTORY — PX: COLON RESECTION: SHX5231

## 2022-10-10 LAB — CBC WITH DIFFERENTIAL/PLATELET
Abs Immature Granulocytes: 0.03 10*3/uL (ref 0.00–0.07)
Basophils Absolute: 0.1 10*3/uL (ref 0.0–0.1)
Basophils Relative: 1 %
Eosinophils Absolute: 0 10*3/uL (ref 0.0–0.5)
Eosinophils Relative: 1 %
HCT: 39.8 % (ref 36.0–46.0)
Hemoglobin: 10.6 g/dL — ABNORMAL LOW (ref 12.0–15.0)
Immature Granulocytes: 1 %
Lymphocytes Relative: 6 %
Lymphs Abs: 0.2 10*3/uL — ABNORMAL LOW (ref 0.7–4.0)
MCH: 23.3 pg — ABNORMAL LOW (ref 26.0–34.0)
MCHC: 26.6 g/dL — ABNORMAL LOW (ref 30.0–36.0)
MCV: 87.7 fL (ref 80.0–100.0)
Monocytes Absolute: 0.2 10*3/uL (ref 0.1–1.0)
Monocytes Relative: 6 %
Neutro Abs: 3.3 10*3/uL (ref 1.7–7.7)
Neutrophils Relative %: 85 %
Platelets: 308 10*3/uL (ref 150–400)
RBC: 4.54 MIL/uL (ref 3.87–5.11)
RDW: 18.4 % — ABNORMAL HIGH (ref 11.5–15.5)
WBC: 3.9 10*3/uL — ABNORMAL LOW (ref 4.0–10.5)
nRBC: 0 % (ref 0.0–0.2)

## 2022-10-10 LAB — COMPREHENSIVE METABOLIC PANEL
ALT: 8 U/L (ref 0–44)
AST: 37 U/L (ref 15–41)
Albumin: 2.7 g/dL — ABNORMAL LOW (ref 3.5–5.0)
Alkaline Phosphatase: 72 U/L (ref 38–126)
Anion gap: 21 — ABNORMAL HIGH (ref 5–15)
BUN: 91 mg/dL — ABNORMAL HIGH (ref 8–23)
CO2: 15 mmol/L — ABNORMAL LOW (ref 22–32)
Calcium: 7.3 mg/dL — ABNORMAL LOW (ref 8.9–10.3)
Chloride: 102 mmol/L (ref 98–111)
Creatinine, Ser: 4.37 mg/dL — ABNORMAL HIGH (ref 0.44–1.00)
GFR, Estimated: 9 mL/min — ABNORMAL LOW (ref >60)
Glucose, Bld: 81 mg/dL (ref 70–99)
Potassium: 3.5 mmol/L (ref 3.5–5.1)
Sodium: 138 mmol/L (ref 135–145)
Total Bilirubin: 0.7 mg/dL (ref 0.3–1.2)
Total Protein: 6.6 g/dL (ref 6.5–8.1)

## 2022-10-10 LAB — TYPE AND SCREEN
ABO/RH(D): A POS
Antibody Screen: NEGATIVE

## 2022-10-10 LAB — LACTIC ACID, PLASMA: Lactic Acid, Venous: 1.3 mmol/L (ref 0.5–1.9)

## 2022-10-10 LAB — CK: Total CK: 335 U/L — ABNORMAL HIGH (ref 38–234)

## 2022-10-10 SURGERY — LAPAROTOMY
Anesthesia: General | Laterality: Right

## 2022-10-10 MED ORDER — PROPOFOL 10 MG/ML IV BOLUS
INTRAVENOUS | Status: AC
  Filled 2022-10-10: qty 20

## 2022-10-10 MED ORDER — LACTATED RINGERS IV BOLUS: 1000.0000 mL | Freq: Once | INTRAVENOUS | Status: DC

## 2022-10-10 MED ORDER — FENTANYL CITRATE (PF) 100 MCG/2ML IJ SOLN
INTRAMUSCULAR | Status: DC | PRN
  Administered 2022-10-10 (×2): 50 ug via INTRAVENOUS

## 2022-10-10 MED ORDER — PANTOPRAZOLE SODIUM 40 MG IV SOLR: 40.0000 mg | Freq: Once | INTRAVENOUS | Status: DC

## 2022-10-10 MED ORDER — SODIUM CHLORIDE 0.9 % IV SOLN: INTRAVENOUS | Status: DC | PRN

## 2022-10-10 MED ORDER — ALBUMIN HUMAN 5 % IV SOLN: INTRAVENOUS | Status: DC | PRN

## 2022-10-10 MED ORDER — BUPIVACAINE-EPINEPHRINE (PF) 0.25% -1:200000 IJ SOLN
INTRAMUSCULAR | Status: AC
  Filled 2022-10-10: qty 30

## 2022-10-10 MED ORDER — FENTANYL CITRATE (PF) 100 MCG/2ML IJ SOLN
INTRAMUSCULAR | Status: AC
  Filled 2022-10-10: qty 2

## 2022-10-10 MED ORDER — SODIUM CHLORIDE 0.9 % IV SOLN
2.0000 g | Freq: Once | INTRAVENOUS | Status: AC
  Administered 2022-10-10: 2 g via INTRAVENOUS
  Filled 2022-10-10: qty 12.5

## 2022-10-10 MED ORDER — PHENYLEPHRINE HCL-NACL 20-0.9 MG/250ML-% IV SOLN
INTRAVENOUS | Status: DC | PRN
  Administered 2022-10-10: 40 ug/min via INTRAVENOUS

## 2022-10-10 MED ORDER — ROCURONIUM BROMIDE 10 MG/ML (PF) SYRINGE
PREFILLED_SYRINGE | INTRAVENOUS | Status: AC
  Filled 2022-10-10: qty 10

## 2022-10-10 MED ORDER — VANCOMYCIN HCL IN DEXTROSE 1-5 GM/200ML-% IV SOLN: 1000.0000 mg | Freq: Once | INTRAVENOUS | Status: DC

## 2022-10-10 MED ORDER — LACTATED RINGERS IV SOLN: INTRAVENOUS | Status: DC | PRN

## 2022-10-10 MED ORDER — VANCOMYCIN HCL 1250 MG/250ML IV SOLN
1250.0000 mg | Freq: Once | INTRAVENOUS | Status: AC
  Administered 2022-10-10: 1250 mg via INTRAVENOUS
  Filled 2022-10-10: qty 250

## 2022-10-10 MED ORDER — PHENYLEPHRINE HCL-NACL 20-0.9 MG/250ML-% IV SOLN
INTRAVENOUS | Status: AC
  Filled 2022-10-10: qty 250

## 2022-10-10 MED ORDER — VASOPRESSIN 20 UNIT/ML IV SOLN
INTRAVENOUS | Status: DC | PRN
  Administered 2022-10-10 – 2022-10-11 (×5): 2 [IU] via INTRAVENOUS

## 2022-10-10 MED ORDER — LACTATED RINGERS IV SOLN: INTRAVENOUS | Status: DC

## 2022-10-10 MED ORDER — LIDOCAINE HCL (PF) 2 % IJ SOLN
INTRAMUSCULAR | Status: AC
  Filled 2022-10-10: qty 5

## 2022-10-10 MED ORDER — ROCURONIUM BROMIDE 100 MG/10ML IV SOLN
INTRAVENOUS | Status: DC | PRN
  Administered 2022-10-10: 30 mg via INTRAVENOUS

## 2022-10-10 MED ORDER — SUCCINYLCHOLINE CHLORIDE 200 MG/10ML IV SOSY
PREFILLED_SYRINGE | INTRAVENOUS | Status: AC
  Filled 2022-10-10: qty 10

## 2022-10-10 MED ORDER — ETOMIDATE 2 MG/ML IV SOLN
INTRAVENOUS | Status: DC | PRN
  Administered 2022-10-10: 10 mg via INTRAVENOUS

## 2022-10-10 MED ORDER — LACTATED RINGERS IV BOLUS
1000.0000 mL | Freq: Once | INTRAVENOUS | Status: AC
  Administered 2022-10-10: 1000 mL via INTRAVENOUS

## 2022-10-10 MED ORDER — ETOMIDATE 2 MG/ML IV SOLN
INTRAVENOUS | Status: AC
  Filled 2022-10-10: qty 10

## 2022-10-10 MED ORDER — SUCCINYLCHOLINE CHLORIDE 200 MG/10ML IV SOSY
PREFILLED_SYRINGE | INTRAVENOUS | Status: DC | PRN
  Administered 2022-10-10: 100 mg via INTRAVENOUS

## 2022-10-10 SURGICAL SUPPLY — 108 items
BAG LAPAROSCOPIC 12 15 PORT 16 (BASKET) IMPLANT
BAG PRESSURE INF REUSE 3000 (BAG) ×4 IMPLANT
BAG RETRIEVAL 12/15 (BASKET)
BLADE CLIPPER SURG (BLADE) ×4 IMPLANT
BLADE SURG SZ10 CARB STEEL (BLADE) ×4 IMPLANT
BULB RESERV EVAC DRAIN JP 100C (MISCELLANEOUS) ×2 IMPLANT
CANNULA REDUC XI 12-8 STAPL (CANNULA)
CANNULA REDUCER 12-8 DVNC XI (CANNULA) ×2 IMPLANT
COVER TIP SHEARS 8 DVNC (MISCELLANEOUS) ×2 IMPLANT
COVER TIP SHEARS 8MM DA VINCI (MISCELLANEOUS)
COVER WAND RF STERILE (DRAPES) ×2 IMPLANT
DERMABOND ADVANCED .7 DNX12 (GAUZE/BANDAGES/DRESSINGS) ×4 IMPLANT
DRAIN CHANNEL JP 19F (MISCELLANEOUS) ×2 IMPLANT
DRAPE ARM DVNC X/XI (DISPOSABLE) ×8 IMPLANT
DRAPE COLUMN DVNC XI (DISPOSABLE) ×2 IMPLANT
DRAPE DA VINCI XI ARM (DISPOSABLE)
DRAPE DA VINCI XI COLUMN (DISPOSABLE)
DRSG OPSITE POSTOP 4X10 (GAUZE/BANDAGES/DRESSINGS) ×2 IMPLANT
DRSG TEGADERM 4X4.75 (GAUZE/BANDAGES/DRESSINGS) ×4 IMPLANT
ELECT BLADE 6.5 EXT (BLADE) ×2 IMPLANT
ELECT REM PT RETURN 9FT ADLT (ELECTROSURGICAL) ×4
ELECTRODE REM PT RTRN 9FT ADLT (ELECTROSURGICAL) ×4 IMPLANT
GELPOINT ADV PLATFORM (ENDOMECHANICALS)
GLOVE ORTHO TXT STRL SZ7.5 (GLOVE) ×16 IMPLANT
GOWN STRL REUS W/ TWL LRG LVL3 (GOWN DISPOSABLE) ×16 IMPLANT
GOWN STRL REUS W/ TWL XL LVL3 (GOWN DISPOSABLE) ×8 IMPLANT
GOWN STRL REUS W/TWL LRG LVL3 (GOWN DISPOSABLE) ×16
GOWN STRL REUS W/TWL XL LVL3 (GOWN DISPOSABLE) ×8
GRASPER SUT TROCAR 14GX15 (MISCELLANEOUS) IMPLANT
HANDLE SUCTION POOLE (INSTRUMENTS) ×2 IMPLANT
HANDLE YANKAUER SUCT BULB TIP (MISCELLANEOUS) ×4 IMPLANT
IRRIGATION STRYKERFLOW (MISCELLANEOUS) IMPLANT
IRRIGATOR STRYKERFLOW (MISCELLANEOUS)
IRRIGATOR SUCT 8 DISP DVNC XI (IRRIGATION / IRRIGATOR) ×4 IMPLANT
IRRIGATOR SUCTION 8MM XI DISP (IRRIGATION / IRRIGATOR) ×4
IV NS 1000ML (IV SOLUTION)
IV NS 1000ML BAXH (IV SOLUTION) IMPLANT
IV NS IRRIG 3000ML ARTHROMATIC (IV SOLUTION) IMPLANT
KIT IMAGING PINPOINTPAQ (MISCELLANEOUS) ×2 IMPLANT
KIT OSTOMY DRAINABLE 2.75 STR (WOUND CARE) ×2 IMPLANT
KIT PINK PAD W/HEAD ARE REST (MISCELLANEOUS) ×4 IMPLANT
KIT PINK PAD W/HEAD ARM REST (MISCELLANEOUS) ×4 IMPLANT
KIT TURNOVER KIT A (KITS) ×4 IMPLANT
LABEL OR SOLS (LABEL) ×4 IMPLANT
LIGASURE IMPACT 36 18CM CVD LR (INSTRUMENTS) ×2 IMPLANT
MANIFOLD NEPTUNE II (INSTRUMENTS) ×4 IMPLANT
NDL INSUFFLATION 14GA 120MM (NEEDLE) ×2 IMPLANT
NEEDLE HYPO 22GX1.5 SAFETY (NEEDLE) ×4 IMPLANT
NEEDLE INSUFFLATION 14GA 120MM (NEEDLE) ×4 IMPLANT
NS IRRIG 1000ML POUR BTL (IV SOLUTION) ×4 IMPLANT
NS IRRIG 500ML POUR BTL (IV SOLUTION) ×4 IMPLANT
PACK COLON CLEAN CLOSURE (MISCELLANEOUS) ×4 IMPLANT
PACK LAP CHOLECYSTECTOMY (MISCELLANEOUS) ×4 IMPLANT
PLATFORM STD W/COL CELL SVR (ENDOMECHANICALS) ×2 IMPLANT
RELOAD LINEAR CUT PROX 55 BLUE (ENDOMECHANICALS) ×4 IMPLANT
RELOAD STAPLE 45 3.5 BLU DVNC (STAPLE) IMPLANT
RELOAD STAPLE 55 3.8 BLU REG (ENDOMECHANICALS) IMPLANT
RELOAD STAPLE 60 3.5 BLU DVNC (STAPLE) IMPLANT
RELOAD STAPLER 3.5X45 BLU DVNC (STAPLE) IMPLANT
RELOAD STAPLER 3.5X60 BLU DVNC (STAPLE) IMPLANT
RETRACTOR WOUND ALXS 18CM MED (MISCELLANEOUS) IMPLANT
RTRCTR WOUND ALEXIS O 18CM MED (MISCELLANEOUS)
SCISSORS METZENBAUM CVD 33 (INSTRUMENTS) IMPLANT
SEAL CANN UNIV 5-8 DVNC XI (MISCELLANEOUS) ×6 IMPLANT
SEAL XI 5MM-8MM UNIVERSAL (MISCELLANEOUS)
SEALER VESSEL DA VINCI XI (MISCELLANEOUS)
SEALER VESSEL EXT DVNC XI (MISCELLANEOUS) IMPLANT
SET TUBE SMOKE EVAC HIGH FLOW (TUBING) ×4 IMPLANT
SOLUTION ELECTROLUBE (MISCELLANEOUS) ×2 IMPLANT
SPIKE FLUID TRANSFER (MISCELLANEOUS) ×4 IMPLANT
SPONGE T-LAP 18X18 ~~LOC~~+RFID (SPONGE) ×10 IMPLANT
SPONGE T-LAP 4X18 ~~LOC~~+RFID (SPONGE) ×4 IMPLANT
STAPLER 45 DA VINCI SURE FORM (STAPLE)
STAPLER 45 SUREFORM DVNC (STAPLE) IMPLANT
STAPLER 60 DA VINCI SURE FORM (STAPLE)
STAPLER 60 SUREFORM DVNC (STAPLE) IMPLANT
STAPLER CANNULA SEAL DVNC XI (STAPLE) ×4 IMPLANT
STAPLER CANNULA SEAL XI (STAPLE) ×4
STAPLER PROXIMATE 55 BLUE (STAPLE) ×2 IMPLANT
STAPLER RELOAD 3.5X45 BLU DVNC (STAPLE)
STAPLER RELOAD 3.5X45 BLUE (STAPLE)
STAPLER RELOAD 3.5X60 BLU DVNC (STAPLE)
STAPLER RELOAD 3.5X60 BLUE (STAPLE)
STAPLER SKIN PROX 35W (STAPLE) IMPLANT
SUCTION POOLE HANDLE (INSTRUMENTS) ×4
SUT ETHILON 3-0 FS-10 30 BLK (SUTURE) ×4
SUT MNCRL 4-0 (SUTURE) ×8
SUT MNCRL 4-0 27XMFL (SUTURE) ×8
SUT PDS AB 0 CT1 27 (SUTURE) ×4 IMPLANT
SUT V-LOC 90 ABS 3-0 VLT  V-20 (SUTURE) ×8
SUT V-LOC 90 ABS 3-0 VLT V-20 (SUTURE) ×8 IMPLANT
SUT VIC AB 0 CT2 27 (SUTURE) ×4 IMPLANT
SUT VIC AB 2-0 RB1 27 (SUTURE) ×4 IMPLANT
SUT VIC AB 2-0 SH 27 (SUTURE) ×8
SUT VIC AB 2-0 SH 27XBRD (SUTURE) ×4 IMPLANT
SUT VIC AB 3-0 SH 27 (SUTURE) ×8
SUT VIC AB 3-0 SH 27X BRD (SUTURE) ×8 IMPLANT
SUT VLOC 90 2/L VL 12 GS22 (SUTURE) IMPLANT
SUT VLOC 90 S/L VL9 GS22 (SUTURE) IMPLANT
SUTURE EHLN 3-0 FS-10 30 BLK (SUTURE) ×2 IMPLANT
SUTURE MNCRL 4-0 27XMF (SUTURE) ×8 IMPLANT
TOWEL OR 17X26 4PK STRL BLUE (TOWEL DISPOSABLE) ×2 IMPLANT
TRAP FLUID SMOKE EVACUATOR (MISCELLANEOUS) ×4 IMPLANT
TRAY FOLEY MTR SLVR 16FR STAT (SET/KITS/TRAYS/PACK) ×4 IMPLANT
TROCAR Z-THREAD FIOS 11X100 BL (TROCAR) ×4 IMPLANT
TROCAR Z-THREAD OPTICAL 5X100M (TROCAR) IMPLANT
TUBING EVAC SMOKE HEATED PNEUM (TUBING) ×2 IMPLANT
WATER STERILE IRR 500ML POUR (IV SOLUTION) ×4 IMPLANT

## 2022-10-10 NOTE — Consult Note (Signed)
PHARMACY -  BRIEF ANTIBIOTIC NOTE   Pharmacy has received consult(s) for vancomycin and cefepime from an ED provider.  The patient's profile has been reviewed for ht/wt/allergies/indication/available labs.    One time order(s) placed for cefepime 2 g and vancomycin 1250 mg  Further antibiotics/pharmacy consults should be ordered by admitting physician if indicated.                       Thank you, Darnelle Bos, PharmD 10/29/22  7:00 PM

## 2022-10-10 NOTE — Anesthesia Preprocedure Evaluation (Addendum)
Anesthesia Evaluation  Patient identified by MRN, date of birth, ID band Patient awake    Reviewed: Allergy & Precautions, NPO status , Patient's Chart, lab work & pertinent test results  History of Anesthesia Complications Negative for: history of anesthetic complications  Airway Mallampati: III  TM Distance: <3 FB Neck ROM: full    Dental  (+) Chipped, Poor Dentition, Missing   Pulmonary COPD, former smoker,    Pulmonary exam normal        Cardiovascular Exercise Tolerance: Good hypertension, (-) anginaNormal cardiovascular exam+ Valvular Problems/Murmurs      Neuro/Psych PSYCHIATRIC DISORDERS Dementia negative neurological ROS     GI/Hepatic negative GI ROS, Neg liver ROS, neg GERD  ,  Endo/Other  Hypothyroidism   Renal/GU ARFRenal disease     Musculoskeletal   Abdominal   Peds  Hematology negative hematology ROS (+)   Anesthesia Other Findings Past Medical History: No date: Aortic aneurysm (HCC) No date: COPD (chronic obstructive pulmonary disease) (HCC) No date: Dementia ([place]) No date: Heart murmur  Past Surgical History: No date: COLONOSCOPY No date: ESOPHAGOGASTRODUODENOSCOPY 07/31/2022: ESOPHAGOGASTRODUODENOSCOPY (EGD) WITH PROPOFOL; N/A     Comment:  Procedure: ESOPHAGOGASTRODUODENOSCOPY (EGD) WITH               PROPOFOL;  Surgeon: Lucilla Lame, MD;  Location: [facility]               ENDOSCOPY;  Service: Endoscopy;  Laterality: N/A; No date: FRACTURE SURGERY  BMI    Body Mass Index: 22.32 kg/m      Reproductive/Obstetrics negative OB ROS                            Anesthesia Physical Anesthesia Plan  ASA: 4 and emergent  Anesthesia Plan: General ETT, Rapid Sequence and Cricoid Pressure   Post-op Pain Management:    Induction: Intravenous  PONV Risk Score and Plan: Ondansetron, Dexamethasone, Midazolam and Treatment may vary due to age or medical condition  Airway  Management Planned: Oral ETT  Additional Equipment:   Intra-op Plan:   Post-operative Plan: Extubation in OR and Possible Post-op intubation/ventilation  Informed Consent: I have reviewed the patients History and Physical, chart, labs and discussed the procedure including the risks, benefits and alternatives for the proposed anesthesia with the patient or authorized representative who has indicated his/her understanding and acceptance.     Dental Advisory Given  Plan Discussed with: Anesthesiologist, CRNA and Surgeon  Anesthesia Plan Comments: (Daughter consented for risks of anesthesia including but not limited to:  - adverse reactions to medications - damage to eyes, teeth, lips or other oral mucosa - nerve damage due to positioning  - sore throat or hoarseness - Damage to heart, brain, nerves, lungs, other parts of body or loss of life  She voiced understanding.)        Anesthesia Quick Evaluation

## 2022-10-10 NOTE — Consult Note (Signed)
CODE SEPSIS - PHARMACY COMMUNICATION  **Broad Spectrum Antibiotics should be administered within 1 hour of Sepsis diagnosis**  Time Code Sepsis Called/Page Received: 1845  Antibiotics Ordered: 1845  Time of 1st antibiotic administration: 2018  Additional action taken by pharmacy: N/A  If necessary, Name of Provider/Nurse Contacted: N/A    Darnelle Bos ,PharmD Clinical Pharmacist  09/21/2022  6:56 PM

## 2022-10-10 NOTE — Progress Notes (Signed)
Pt being followed by [facility] for Sepsis protocol. 

## 2022-10-10 NOTE — H&P (Signed)
Patient ID: Paige Parker, female   DOB: 25-Jan-1936, 86 y.o.   MRN: [id number]  Chief Complaint: Altered behavior  History of Present Illness Paige Parker is a 86 y.o. female who is presented to the ED, by/with her daughter as she found the patient to be extremely weak today, with changes in speech and behavior.  Severe diarrhea noted at home, patient was recently started on antibiotics for UTI.  No known trauma.  Underwent an upper GI procedure a month ago for angiodysplasia with bleeding.  Patient is alert, responsive and has little or no complaints.  Past Medical History Past Medical History:  Diagnosis Date   Aortic aneurysm (HCC)    COPD (chronic obstructive pulmonary disease) (HCC)    Dementia (HCC)    Heart murmur       Past Surgical History:  Procedure Laterality Date   COLONOSCOPY     ESOPHAGOGASTRODUODENOSCOPY     ESOPHAGOGASTRODUODENOSCOPY (EGD) WITH PROPOFOL N/A 07/31/2022   Procedure: ESOPHAGOGASTRODUODENOSCOPY (EGD) WITH PROPOFOL;  Surgeon: Midge Minium, MD;  Location: [facility] ENDOSCOPY;  Service: Endoscopy;  Laterality: N/A;   FRACTURE SURGERY      No Known Allergies  Current Facility-Administered Medications  Medication Dose Route Frequency Provider Last Rate Last Admin   pantoprazole (PROTONIX) injection 40 mg  40 mg Intravenous Once Sharyn Creamer, MD       vancomycin (VANCOREADY) IVPB 1250 mg/250 mL  1,250 mg Intravenous Once Derrek Gu, RPH 166.7 mL/hr at 10/04/2022 2038 1,250 mg at 10/08/2022 2038   Current Outpatient Medications  Medication Sig Dispense Refill   atorvastatin (LIPITOR) 10 MG tablet Take 10 mg by mouth daily.     Cyanocobalamin (VITAMIN B12) 500 MCG TABS Take 500 mcg by mouth daily in the afternoon.     donepezil (ARICEPT) 5 MG tablet Take 5 mg by mouth at bedtime.     ferrous sulfate 325 (65 FE) MG tablet Take 325 mg by mouth daily.     folic acid (FOLVITE) 400 MCG tablet Take 400 mcg by mouth daily.     levothyroxine (SYNTHROID) 112 MCG tablet TAKE  1 TABLET (112 MCG TOTAL) BY MOUTH ONCE DAILY FOR 180 DAYS TAKE ON AN EMPTY STOMACH WITH A GLASS OF WATER AT LEAST 30-60 MINUTES BEFORE BREAKFAST.     LINZESS 72 MCG capsule Take 1 capsule (72 mcg total) by mouth daily as needed. 30 capsule    losartan (COZAAR) 25 MG tablet Take 25 mg by mouth daily.     memantine (NAMENDA) 10 MG tablet Take 10 mg by mouth 2 (two) times daily.     verapamil (CALAN) 40 MG tablet Take 40 mg by mouth 2 (two) times daily.      Family History No family history on file.    Social History Social History   Tobacco Use   Smoking status: Former    Packs/day: 1.00    Years: 63.00    Total pack years: 63.00    Types: Cigarettes  Substance Use Topics   Alcohol use: Not Currently   Drug use: Never        Review of Systems  Unable to perform ROS: Dementia      Physical Exam Blood pressure (!) 82/54, pulse 89, temperature (!) 97.4 F (36.3 C), temperature source Oral, resp. rate (!) 21, height 5\' 11"  (1.803 m), weight 72.6 kg, SpO2 90 %. Last Weight  Most recent update: 09/26/2022  6:13 PM    Weight  72.6 kg (160 lb)  CONSTITUTIONAL: Well developed, and nourished, appropriately responsive and aware without distress.   EYES: Sclera non-icteric.   EARS, NOSE, MOUTH AND THROAT:  The oropharynx is clear. Oral mucosa is pink but dry.  NECK: Trachea is midline, and there is no jugular venous distension.  LYMPH NODES:  Lymph nodes in the neck are not enlarged. RESPIRATORY:  Normal respiratory effort without pathologic use of accessory muscles. CARDIOVASCULAR: Heart is regular in rate and slightly irregular rhythm. GI: The abdomen is distended, but soft, nontender.  Tympanitic.  There were no palpable masses.  There are tender palpable masses in the groins.  MUSCULOSKELETAL: No gross bony defects, ulcers.  Extremities warm. SKIN: Skin turgor is normal.  NEUROLOGIC:  Motor and sensation appear grossly normal.  Cranial nerves are grossly  without defect. PSYCH:  Affect is seemingly oblivious for situation.  Data Reviewed I have personally reviewed what is currently available of the patient's imaging, recent labs and medical records.   Labs:     Latest Ref Rng & Units 08/01/2022   12:05 PM 08/01/2022    7:05 AM 07/31/2022    4:17 PM  CBC  WBC 4.0 - 10.5 K/uL  12.4    Hemoglobin 12.0 - 15.0 g/dL 8.0  8.1  9.1   Hematocrit 36.0 - 46.0 %  26.1    Platelets 150 - 400 K/uL  496        Latest Ref Rng & Units 08/01/2022    7:05 AM 07/31/2022    5:28 AM 07/30/2022    3:11 PM  CMP  Glucose 70 - 99 mg/dL 114  66  84   BUN 8 - 23 mg/dL 6  15  17    Creatinine 0.44 - 1.00 mg/dL 0.50  0.65  0.61   Sodium 135 - 145 mmol/L 138  139  137   Potassium 3.5 - 5.1 mmol/L 3.3  3.6  3.2   Chloride 98 - 111 mmol/L 105  105  101   CO2 22 - 32 mmol/L 27  23  25    Calcium 8.9 - 10.3 mg/dL 8.0  7.8  8.4   Total Protein 6.5 - 8.1 g/dL  6.0  6.6   Total Bilirubin 0.3 - 1.2 mg/dL  1.1  0.7   Alkaline Phos 38 - 126 U/L  60  66   AST 15 - 41 U/L  16  19   ALT 0 - 44 U/L  13  14       Imaging: Radiological images reviewed:   Within last 24 hrs: CT ABDOMEN PELVIS WO CONTRAST  Result Date: 10/12/2022 CLINICAL DATA:  Abdominal pain. Free air noted on the earlier radiograph. EXAM: CT ABDOMEN AND PELVIS WITHOUT CONTRAST TECHNIQUE: Multidetector CT imaging of the abdomen and pelvis was performed following the standard protocol without IV contrast. RADIATION DOSE REDUCTION: This exam was performed according to the departmental dose-optimization program which includes automated exposure control, adjustment of the mA and/or kV according to patient size and/or use of iterative reconstruction technique. COMPARISON:  Chest radiograph dated 09/22/2022 and CT of the abdomen pelvis dated 06/09/2018. FINDINGS: Evaluation of this exam is limited in the absence of intravenous contrast. Lower chest: Mild eventration of the right hemidiaphragm. Minimal right lung  base atelectasis. The visualized lung bases are otherwise clear. There is coronary vascular calcification. There is large pneumoperitoneum.  Small ascites. Hepatobiliary: The liver is grossly unremarkable. No biliary ductal dilatation. The gallbladder is unremarkable. Pancreas: The pancreas is unremarkable Spleen: Normal in size without  focal abnormality. Adrenals/Urinary Tract: Vascular calcification versus nonobstructing bilateral renal calculi. There is no hydronephrosis on either side. Left renal upper pole cyst measuring 8 cm. Several additional bilateral renal hypodense lesions suboptimally characterized on this noncontrast CT. These can be better evaluated with ultrasound on a nonemergent/outpatient basis. The visualized ureters appear unremarkable. Urinary bladder is collapsed. Stomach/Bowel: Bilateral inguinal hernias, right greater than left containing loops of small bowel. The neck of the right inguinal hernia defect measures approximately 3.8 cm in greatest axial diameter. There is apparent in pinching of the neck of the herniated loop of bowel in the left inguinal canal. There is dilatation of small-bowel loops measuring up to approximately 4 cm consistent with obstruction. The point of obstruction is likely in the region of the left inguinal canal. There is severe diffuse colonic diverticulosis. There is diffuse mesenteric edema and diffuse thickened and inflamed loops of bowel concerning for ischemia. Clinical correlation and surgical consult is advised. The appendix is poorly visualized. Vascular/Lymphatic: Advanced aortoiliac atherosclerotic disease. The IVC is unremarkable. No portal venous gas. Left retroperitoneal adenopathy measures 14 mm in short axis. Reproductive: The uterus is poorly visualized and suboptimally evaluated. Other: Mild diffuse subcutaneous edema. Musculoskeletal: Osteopenia with degenerative changes of the spine. No acute osseous pathology. IMPRESSION: 1. Large  pneumoperitoneum with findings concerning for bowel ischemia, likely secondary to obstruction in the left inguinal hernia. Surgical consult is advised. 2. Severe diffuse colonic diverticulosis. 3. Left retroperitoneal adenopathy, likely reactive. 4.  Aortic Atherosclerosis (ICD10-I70.0). These results were called by telephone at the time of interpretation on Oct 23, 2022 at 8:11 pm to provider MARK QUALE , who verbally acknowledged these results. Electronically Signed   By: Elgie Collard M.D.   On: October 23, 2022 20:14   CT Head Wo Contrast  Result Date: 10-23-2022 CLINICAL DATA:  Weakness, confusion EXAM: CT HEAD WITHOUT CONTRAST TECHNIQUE: Contiguous axial images were obtained from the base of the skull through the vertex without intravenous contrast. RADIATION DOSE REDUCTION: This exam was performed according to the departmental dose-optimization program which includes automated exposure control, adjustment of the mA and/or kV according to patient size and/or use of iterative reconstruction technique. COMPARISON:  None Available. FINDINGS: Brain: No evidence of acute infarction, hemorrhage, hydrocephalus, extra-axial collection or mass lesion/mass effect. Periventricular and deep white matter hypodensity. Vascular: No hyperdense vessel or unexpected calcification. Skull: Normal. Negative for fracture or focal lesion. Sinuses/Orbits: No acute finding. Other: None. IMPRESSION: No acute intracranial pathology. Small-vessel white matter disease. Electronically Signed   By: Jearld Lesch M.D.   On: 2022-10-23 20:07   DG Chest Port 1 View  Result Date: 2022-10-23 CLINICAL DATA:  Questionable sepsis EXAM: PORTABLE CHEST 1 VIEW COMPARISON:  Chest x-ray 07/30/2022.  Chest CT 04/22/2022. FINDINGS: There is a large amount of free intraperitoneal air in the upper abdomen. There is some prominent/dilated small bowel loops in the upper abdomen. The heart size and mediastinal contours are within normal limits. Both lungs  are clear. The visualized skeletal structures are unremarkable. IMPRESSION: 1. Large amount of free intraperitoneal air in the upper abdomen. 2. Prominent/dilated small bowel loops in the upper abdomen, concerning for small bowel obstruction. These results were called by telephone at the time of interpretation on 10-23-22 at 7:20 pm to provider MARK QUALE , who verbally acknowledged these results. Electronically Signed   By: Darliss Cheney M.D.   On: 10-23-22 19:21    Assessment    Suspect source of free air is small bowel strangulation/perforation from inguinal hernia. Patient  Active Problem List   Diagnosis Date Noted   Angiodysplasia of stomach 08/01/2022   Acute blood loss anemia    GI bleed 07/30/2022   Ascending aortic aneurysm (HCC) 08/31/2019   COPD, moderate (HCC) 02/28/2019   Tobacco use 02/28/2019   Diverticulosis of large intestine without hemorrhage 05/04/2016   Acquired hypothyroidism 02/03/2016   Essential hypertension 02/03/2016   Dementia without behavioral disturbance (HCC) 02/03/2016    Plan    Robotic reduction of incarcerated inguinal hernias, likely small bowel resection with repair of same.  Possible exploratory laparotomy.  Face-to-face time spent with the patient and accompanying care providers(if present) was 45 minutes, with more than 50% of the time spent counseling, educating, and coordinating care of the patient.   Risks of anesthesia, bleeding, infection, sepsis, worsening mental status, shock, organ failure, anastomotic leak, recurrent hernia.  Patient's daughter and husband are well aware that these risks are not all inclusive, and desires to proceed.  Questions answered, no guarantees ever expressed or implied.  These notes generated with voice recognition software. I apologize for typographical errors.  Campbell Lerner M.D., FACS 10-22-22, 9:24 PM

## 2022-10-10 NOTE — Anesthesia Procedure Notes (Signed)
Procedure Name: Intubation Date/Time: October 18, 2022 10:34 PM  Performed by: Garner Nash, CRNAPre-anesthesia Checklist: Patient identified, Emergency Drugs available, Suction available and Patient being monitored Patient Re-evaluated:Patient Re-evaluated prior to induction Oxygen Delivery Method: Circle system utilized Preoxygenation: Pre-oxygenation with 100% oxygen Induction Type: IV induction Laryngoscope Size: McGraph and 3 Grade View: Grade II Tube type: Oral Tube size: 6.5 mm Number of attempts: 1 Airway Equipment and Method: Stylet Placement Confirmation: ETT inserted through vocal cords under direct vision, positive ETCO2 and breath sounds checked- equal and bilateral Secured at: 20 cm Tube secured with: Tape Dental Injury: Teeth and Oropharynx as per pre-operative assessment

## 2022-10-10 NOTE — ED Provider Notes (Signed)
[facility] Provider Note    Event Date/Time   First MD Initiated Contact with Patient 10/14/2022 1818     (approximate)   History   Weakness  EM caveat, confusion  HPI  Paige Parker is a 86 y.o. female   history of abdominal aortic aneurysm COPD dementia  Patient presents with her daughter.  Daughter advised that yesterday patient was normal on the phone.  Today she found her with slight confusion, also noted to have diarrhea fairly severe in the home, and she seemed very weak  Was recently been treated for about 1 day with antibiotic for urinary tract infection diagnosed by her primary care after a urine test few days ago as part of a routine panel  No falls or injuries known.  She was recently treated for a bleeding problem in her stomach and received blood transfusion in August for that.  The patient expresses no shortness of breath.  No chest pain.  No headaches or fevers.  Daughter reports the patient also reports she has slightly sore throat that she was just made aware of      Physical Exam   Triage Vital Signs: ED Triage Vitals  Enc Vitals Group     BP 2022-10-14 1808 (!) 82/54     Pulse Rate 10-14-22 1808 68     Resp 2022-10-14 1808 (!) 21     Temp 14-Oct-2022 1808 (!) 97.4 F (36.3 C)     Temp Source 2022/10/14 1808 Oral     SpO2 2022/10/14 1808 90 %     Weight Oct 14, 2022 1812 160 lb (72.6 kg)     Height 14-Oct-2022 1812 5\' 11"  (1.803 m)     Head Circumference --      Peak Flow --      Pain Score Oct 14, 2022 1812 0     Pain Loc --      Pain Edu? --      Excl. in GC? --     Most recent vital signs: Vitals:   14-Oct-2022 1808 10/14/22 2000  BP: (!) 82/54   Pulse: 68 89  Resp: (!) 21   Temp: (!) 97.4 F (36.3 C)   SpO2: 90% 90%     General: Awake, no distress.  Oriented to self and daughter, but not to date or year.  Patient does not seem to have good grasp of the situation leading to her coming to the ER CV:  Good peripheral perfusion.   Normal heart tones without murmur Resp:  Normal effort.  Clear lung fields bilaterally. Abd:  No distention.  Patient reports some tenderness to palpation in the mid abdomen, but no rebound or guarding is noted.  No obvious distention Other:  Peripheral extremities slightly pale, normal capillary refill though   ED Results / Procedures / Treatments   Labs (all labs ordered are listed, but only abnormal results are displayed) Labs Reviewed  COMPREHENSIVE METABOLIC PANEL - Abnormal; Notable for the following components:      Result Value   CO2 15 (*)    BUN 91 (*)    Creatinine, Ser 4.37 (*)    Calcium 7.3 (*)    Albumin 2.7 (*)    GFR, Estimated 9 (*)    Anion gap 21 (*)    All other components within normal limits  CBC WITH DIFFERENTIAL/PLATELET - Abnormal; Notable for the following components:   WBC 3.9 (*)    Hemoglobin 10.6 (*)    MCH 23.3 (*)  MCHC 26.6 (*)    RDW 18.4 (*)    Lymphs Abs 0.2 (*)    All other components within normal limits  RESP PANEL BY RT-PCR (FLU A&B, COVID) ARPGX2  CULTURE, BLOOD (ROUTINE X 2)  CULTURE, BLOOD (ROUTINE X 2)  URINE CULTURE  C DIFFICILE QUICK SCREEN W PCR REFLEX    LACTIC ACID, PLASMA  LACTIC ACID, PLASMA  URINALYSIS, COMPLETE (UACMP) WITH MICROSCOPIC  PROTIME-INR  APTT  CK  TYPE AND SCREEN    RADIOLOGY  I personally interpreted the patient's chest x-ray is concerning for acute free air under the diaphragm   CT ABDOMEN PELVIS WO CONTRAST  Result Date: 11-04-2022 CLINICAL DATA:  Abdominal pain. Free air noted on the earlier radiograph. EXAM: CT ABDOMEN AND PELVIS WITHOUT CONTRAST TECHNIQUE: Multidetector CT imaging of the abdomen and pelvis was performed following the standard protocol without IV contrast. RADIATION DOSE REDUCTION: This exam was performed according to the departmental dose-optimization program which includes automated exposure control, adjustment of the mA and/or kV according to patient size and/or use of  iterative reconstruction technique. COMPARISON:  Chest radiograph dated November 04, 2022 and CT of the abdomen pelvis dated 06/09/2018. FINDINGS: Evaluation of this exam is limited in the absence of intravenous contrast. Lower chest: Mild eventration of the right hemidiaphragm. Minimal right lung base atelectasis. The visualized lung bases are otherwise clear. There is coronary vascular calcification. There is large pneumoperitoneum.  Small ascites. Hepatobiliary: The liver is grossly unremarkable. No biliary ductal dilatation. The gallbladder is unremarkable. Pancreas: The pancreas is unremarkable Spleen: Normal in size without focal abnormality. Adrenals/Urinary Tract: Vascular calcification versus nonobstructing bilateral renal calculi. There is no hydronephrosis on either side. Left renal upper pole cyst measuring 8 cm. Several additional bilateral renal hypodense lesions suboptimally characterized on this noncontrast CT. These can be better evaluated with ultrasound on a nonemergent/outpatient basis. The visualized ureters appear unremarkable. Urinary bladder is collapsed. Stomach/Bowel: Bilateral inguinal hernias, right greater than left containing loops of small bowel. The neck of the right inguinal hernia defect measures approximately 3.8 cm in greatest axial diameter. There is apparent in pinching of the neck of the herniated loop of bowel in the left inguinal canal. There is dilatation of small-bowel loops measuring up to approximately 4 cm consistent with obstruction. The point of obstruction is likely in the region of the left inguinal canal. There is severe diffuse colonic diverticulosis. There is diffuse mesenteric edema and diffuse thickened and inflamed loops of bowel concerning for ischemia. Clinical correlation and surgical consult is advised. The appendix is poorly visualized. Vascular/Lymphatic: Advanced aortoiliac atherosclerotic disease. The IVC is unremarkable. No portal venous gas. Left  retroperitoneal adenopathy measures 14 mm in short axis. Reproductive: The uterus is poorly visualized and suboptimally evaluated. Other: Mild diffuse subcutaneous edema. Musculoskeletal: Osteopenia with degenerative changes of the spine. No acute osseous pathology. IMPRESSION: 1. Large pneumoperitoneum with findings concerning for bowel ischemia, likely secondary to obstruction in the left inguinal hernia. Surgical consult is advised. 2. Severe diffuse colonic diverticulosis. 3. Left retroperitoneal adenopathy, likely reactive. 4.  Aortic Atherosclerosis (ICD10-I70.0). These results were called by telephone at the time of interpretation on 11/04/22 at 8:11 pm to provider Cailen Texeira , who verbally acknowledged these results. Electronically Signed   By: Elgie Collard M.D.   On: 2022-11-04 20:14   CT Head Wo Contrast  Result Date: 11/04/22 CLINICAL DATA:  Weakness, confusion EXAM: CT HEAD WITHOUT CONTRAST TECHNIQUE: Contiguous axial images were obtained from the base of the skull through  the vertex without intravenous contrast. RADIATION DOSE REDUCTION: This exam was performed according to the departmental dose-optimization program which includes automated exposure control, adjustment of the mA and/or kV according to patient size and/or use of iterative reconstruction technique. COMPARISON:  None Available. FINDINGS: Brain: No evidence of acute infarction, hemorrhage, hydrocephalus, extra-axial collection or mass lesion/mass effect. Periventricular and deep white matter hypodensity. Vascular: No hyperdense vessel or unexpected calcification. Skull: Normal. Negative for fracture or focal lesion. Sinuses/Orbits: No acute finding. Other: None. IMPRESSION: No acute intracranial pathology. Small-vessel white matter disease. Electronically Signed   By: Delanna Ahmadi M.D.   On: 10/05/2022 20:07   DG Chest Port 1 View  Result Date: 09/21/2022 CLINICAL DATA:  Questionable sepsis EXAM: PORTABLE CHEST 1 VIEW  COMPARISON:  Chest x-ray 07/30/2022.  Chest CT 04/22/2022. FINDINGS: There is a large amount of free intraperitoneal air in the upper abdomen. There is some prominent/dilated small bowel loops in the upper abdomen. The heart size and mediastinal contours are within normal limits. Both lungs are clear. The visualized skeletal structures are unremarkable. IMPRESSION: 1. Large amount of free intraperitoneal air in the upper abdomen. 2. Prominent/dilated small bowel loops in the upper abdomen, concerning for small bowel obstruction. These results were called by telephone at the time of interpretation on 10/07/2022 at 7:20 pm to provider Keyshun Elpers , who verbally acknowledged these results. Electronically Signed   By: Ronney Asters M.D.   On: 09/20/2022 19:21      Discussed both the patient's chest x-ray as well as her abdominal CT with our radiologist   PROCEDURES:  Critical Care performed: Yes, see critical care procedure note(s)  Procedures CRITICAL CARE Performed by: Delman Kitten   Total critical care time: 40 minutes  Critical care time was exclusive of separately billable procedures and treating other patients.  Critical care was necessary to treat or prevent imminent or life-threatening deterioration.  Critical care was time spent personally by me on the following activities: development of treatment plan with patient and/or surrogate as well as nursing, discussions with consultants, evaluation of patient's response to treatment, examination of patient, obtaining history from patient or surrogate, ordering and performing treatments and interventions, ordering and review of laboratory studies, ordering and review of radiographic studies, pulse oximetry and re-evaluation of patient's condition.   MEDICATIONS ORDERED IN ED: Medications  pantoprazole (PROTONIX) injection 40 mg ( Intravenous MAR Hold 10/05/2022 2227)  lactated ringers bolus 1,000 mL ( Intravenous MAR Hold 10/03/2022 2227)   lactated ringers bolus 1,000 mL (1,000 mLs Intravenous New Bag/Given 09/16/2022 2037)  ceFEPIme (MAXIPIME) 2 g in sodium chloride 0.9 % 100 mL IVPB (0 g Intravenous Stopped 10/07/2022 2048)  vancomycin (VANCOREADY) IVPB 1250 mg/250 mL (1,250 mg Intravenous New Bag/Given 10/03/2022 2038)     IMPRESSION / MDM / ASSESSMENT AND PLAN / ED COURSE  I reviewed the triage vital signs and the nursing notes.                              Differential diagnosis includes, but is not limited to, possible sepsis urinary tract infection, intra-abdominal infection, pneumonia, stroke, intracranial hemorrhage, etc. are all considered.  My initial differential was quite broad but very potentially focused on dehydration possible sepsis given the report of urinary tract infection now with worsening symptoms and also possible intra-abdominal infection colitis C. difficile etc.  However, upon review of the patient's chest x-ray it is obvious that there is a  intra-abdominal catastrophe that may be present.  Stat CT scan of the abdomen pelvis ordered, if consulted with Dr. Claudine Mouton.  IV access established resuscitation beginning, nursing had initially difficulty obtaining labs but will be working diligently to obtain labs.   Patient deemed critical, emergent work-up undergoing  Patient's presentation is most consistent with acute presentation with potential threat to life or bodily function.  The patient is on the cardiac monitor to evaluate for evidence of arrhythmia and/or significant heart rate changes.  Clinical Course as of 10/25/2022 2250  Sat 10-25-22  2007 Delay in lab collection.  Nursing attending with the trauma patient at this time.  Obtaining and sending labs for this patient is #1 priority once nursing available [MQ]    Clinical Course User Index [MQ] Sharyn Creamer, MD   ----------------------------------------- 7:31 PM on Oct 25, 2022 ----------------------------------------- Concern for perforated  viscus.  Dr. Claudine Mouton advised he will be coming to see patient for stat consult.  Discussed this concerning finding with the patient and her daughter.  Daughter advises she is also the healthcare power of attorney.  As the patient is confused defer to the daughter.  Daughter advises the patient has expressed that she would not wish to be resuscitated, and she would not wish to undergo heroic type measures such as CPR or intubation.  However they are amenable to seeing surgery, further work-up, blood transfusions antibiotics IV fluids etc. but if the patient appears to be near death or periarrest she would not wish to have CPR or intubation in that particular setting. Patient to be DNR (exceptions for surgery type items considered though).  ----------------------------------------- 10:03 PM on 2022-10-25 ----------------------------------------- Dr. Dolores Frame notified me plan for operative intervention.  He is currently planning to take her to the OR this evening.  I did notify him of the patient's lab results including acute kidney injury with creatinine of slightly greater than 4.  At this point I will give additional fluids.  Of note the patient's elevated BUN is quite notable as well.  Her hemoglobin is 10 which is somewhat reassuring though I would estimate there may be some element of hemoconcentration.  She is currently alert oriented.  Family at bedside.  Patient to operating theater with Dr. Claudine Mouton  FINAL CLINICAL IMPRESSION(S) / ED DIAGNOSES   Final diagnoses:  Acute abdomen  AKI (acute kidney injury) (HCC)  Uremia     Rx / DC Orders   ED Discharge Orders     None        Note:  This document was prepared using Dragon voice recognition software and may include unintentional dictation errors.   Sharyn Creamer, MD 10/25/22 2250

## 2022-10-10 NOTE — ED Triage Notes (Addendum)
Pt accompanied by daughter who reports pt is extremely weak, confused. Per daughter pt's speech is slurred. Per daughter pt just started potassium yesterday

## 2022-10-11 ENCOUNTER — Inpatient Hospital Stay: Payer: Medicare Other

## 2022-10-11 ENCOUNTER — Other Ambulatory Visit: Payer: Self-pay

## 2022-10-11 DIAGNOSIS — R100 Acute abdomen: Secondary | ICD-10-CM | POA: Diagnosis not present

## 2022-10-11 DIAGNOSIS — E039 Hypothyroidism, unspecified: Secondary | ICD-10-CM | POA: Diagnosis present

## 2022-10-11 DIAGNOSIS — K3532 Acute appendicitis with perforation and localized peritonitis, without abscess: Secondary | ICD-10-CM

## 2022-10-11 DIAGNOSIS — N170 Acute kidney failure with tubular necrosis: Secondary | ICD-10-CM | POA: Diagnosis present

## 2022-10-11 DIAGNOSIS — N390 Urinary tract infection, site not specified: Secondary | ICD-10-CM | POA: Diagnosis present

## 2022-10-11 DIAGNOSIS — K219 Gastro-esophageal reflux disease without esophagitis: Secondary | ICD-10-CM | POA: Diagnosis present

## 2022-10-11 DIAGNOSIS — R198 Other specified symptoms and signs involving the digestive system and abdomen: Secondary | ICD-10-CM | POA: Diagnosis present

## 2022-10-11 DIAGNOSIS — J9602 Acute respiratory failure with hypercapnia: Secondary | ICD-10-CM

## 2022-10-11 DIAGNOSIS — Z8679 Personal history of other diseases of the circulatory system: Secondary | ICD-10-CM | POA: Diagnosis not present

## 2022-10-11 DIAGNOSIS — A419 Sepsis, unspecified organism: Secondary | ICD-10-CM | POA: Diagnosis present

## 2022-10-11 DIAGNOSIS — I10 Essential (primary) hypertension: Secondary | ICD-10-CM | POA: Diagnosis present

## 2022-10-11 DIAGNOSIS — E874 Mixed disorder of acid-base balance: Secondary | ICD-10-CM | POA: Diagnosis not present

## 2022-10-11 DIAGNOSIS — K660 Peritoneal adhesions (postprocedural) (postinfection): Secondary | ICD-10-CM | POA: Diagnosis present

## 2022-10-11 DIAGNOSIS — I700 Atherosclerosis of aorta: Secondary | ICD-10-CM | POA: Diagnosis present

## 2022-10-11 DIAGNOSIS — K35201 Acute appendicitis with generalized peritonitis, with perforation, without abscess: Secondary | ICD-10-CM | POA: Diagnosis present

## 2022-10-11 DIAGNOSIS — E538 Deficiency of other specified B group vitamins: Secondary | ICD-10-CM | POA: Diagnosis present

## 2022-10-11 DIAGNOSIS — R531 Weakness: Secondary | ICD-10-CM | POA: Diagnosis present

## 2022-10-11 DIAGNOSIS — K5730 Diverticulosis of large intestine without perforation or abscess without bleeding: Secondary | ICD-10-CM | POA: Diagnosis present

## 2022-10-11 DIAGNOSIS — J9601 Acute respiratory failure with hypoxia: Secondary | ICD-10-CM | POA: Diagnosis not present

## 2022-10-11 DIAGNOSIS — J449 Chronic obstructive pulmonary disease, unspecified: Secondary | ICD-10-CM | POA: Diagnosis present

## 2022-10-11 DIAGNOSIS — R6521 Severe sepsis with septic shock: Secondary | ICD-10-CM

## 2022-10-11 DIAGNOSIS — K668 Other specified disorders of peritoneum: Secondary | ICD-10-CM | POA: Diagnosis not present

## 2022-10-11 DIAGNOSIS — Z66 Do not resuscitate: Secondary | ICD-10-CM | POA: Diagnosis present

## 2022-10-11 DIAGNOSIS — F0390 Unspecified dementia without behavioral disturbance: Secondary | ICD-10-CM | POA: Diagnosis present

## 2022-10-11 DIAGNOSIS — N179 Acute kidney failure, unspecified: Secondary | ICD-10-CM | POA: Diagnosis not present

## 2022-10-11 DIAGNOSIS — K4000 Bilateral inguinal hernia, with obstruction, without gangrene, not specified as recurrent: Secondary | ICD-10-CM | POA: Diagnosis present

## 2022-10-11 DIAGNOSIS — R197 Diarrhea, unspecified: Secondary | ICD-10-CM | POA: Diagnosis present

## 2022-10-11 DIAGNOSIS — K5520 Angiodysplasia of colon without hemorrhage: Secondary | ICD-10-CM | POA: Diagnosis present

## 2022-10-11 DIAGNOSIS — E876 Hypokalemia: Secondary | ICD-10-CM | POA: Diagnosis not present

## 2022-10-11 DIAGNOSIS — Z515 Encounter for palliative care: Secondary | ICD-10-CM | POA: Diagnosis not present

## 2022-10-11 LAB — CBC WITH DIFFERENTIAL/PLATELET
Abs Immature Granulocytes: 0.1 10*3/uL — ABNORMAL HIGH (ref 0.00–0.07)
Basophils Absolute: 0.1 10*3/uL (ref 0.0–0.1)
Basophils Relative: 1 %
Eosinophils Absolute: 0 10*3/uL (ref 0.0–0.5)
Eosinophils Relative: 0 %
HCT: 33.1 % — ABNORMAL LOW (ref 36.0–46.0)
Hemoglobin: 9.5 g/dL — ABNORMAL LOW (ref 12.0–15.0)
Immature Granulocytes: 1 %
Lymphocytes Relative: 4 %
Lymphs Abs: 0.3 10*3/uL — ABNORMAL LOW (ref 0.7–4.0)
MCH: 23 pg — ABNORMAL LOW (ref 26.0–34.0)
MCHC: 28.7 g/dL — ABNORMAL LOW (ref 30.0–36.0)
MCV: 80.1 fL (ref 80.0–100.0)
Monocytes Absolute: 0.2 10*3/uL (ref 0.1–1.0)
Monocytes Relative: 3 %
Neutro Abs: 6.8 10*3/uL (ref 1.7–7.7)
Neutrophils Relative %: 91 %
Platelets: 440 10*3/uL — ABNORMAL HIGH (ref 150–400)
RBC: 4.13 MIL/uL (ref 3.87–5.11)
RDW: 18.1 % — ABNORMAL HIGH (ref 11.5–15.5)
WBC: 7.4 10*3/uL (ref 4.0–10.5)
nRBC: 0 % (ref 0.0–0.2)

## 2022-10-11 LAB — BLOOD GAS, ARTERIAL
Acid-base deficit: 14.5 mmol/L — ABNORMAL HIGH (ref 0.0–2.0)
Bicarbonate: 16.9 mmol/L — ABNORMAL LOW (ref 20.0–28.0)
FIO2: 100 %
MECHVT: 400 mL
Mechanical Rate: 18
O2 Saturation: 83.1 %
PEEP: 7 cmH2O
Patient temperature: 37
pCO2 arterial: 64 mmHg — ABNORMAL HIGH (ref 32–48)
pH, Arterial: 7.03 — CL (ref 7.35–7.45)
pO2, Arterial: 59 mmHg — ABNORMAL LOW (ref 83–108)

## 2022-10-11 LAB — BASIC METABOLIC PANEL
Anion gap: 15 (ref 5–15)
BUN: 84 mg/dL — ABNORMAL HIGH (ref 8–23)
CO2: 18 mmol/L — ABNORMAL LOW (ref 22–32)
Calcium: 6.7 mg/dL — ABNORMAL LOW (ref 8.9–10.3)
Chloride: 105 mmol/L (ref 98–111)
Creatinine, Ser: 3.67 mg/dL — ABNORMAL HIGH (ref 0.44–1.00)
GFR, Estimated: 12 mL/min — ABNORMAL LOW (ref >60)
Glucose, Bld: 192 mg/dL — ABNORMAL HIGH (ref 70–99)
Potassium: 3.2 mmol/L — ABNORMAL LOW (ref 3.5–5.1)
Sodium: 138 mmol/L (ref 135–145)

## 2022-10-11 LAB — LACTIC ACID, PLASMA
Lactic Acid, Venous: 1.6 mmol/L (ref 0.5–1.9)
Lactic Acid, Venous: 2.8 mmol/L (ref 0.5–1.9)
Lactic Acid, Venous: 1.7 mmol/L (ref 0.5–1.9)

## 2022-10-11 LAB — PROTIME-INR
INR: 1.3 — ABNORMAL HIGH (ref 0.8–1.2)
Prothrombin Time: 16.4 seconds — ABNORMAL HIGH (ref 11.4–15.2)

## 2022-10-11 LAB — CBC
HCT: 30.5 % — ABNORMAL LOW (ref 36.0–46.0)
Hemoglobin: 8.6 g/dL — ABNORMAL LOW (ref 12.0–15.0)
MCH: 23.3 pg — ABNORMAL LOW (ref 26.0–34.0)
MCHC: 28.2 g/dL — ABNORMAL LOW (ref 30.0–36.0)
MCV: 82.7 fL (ref 80.0–100.0)
Platelets: 441 10*3/uL — ABNORMAL HIGH (ref 150–400)
RBC: 3.69 MIL/uL — ABNORMAL LOW (ref 3.87–5.11)
RDW: 18 % — ABNORMAL HIGH (ref 11.5–15.5)
WBC: 2.9 10*3/uL — ABNORMAL LOW (ref 4.0–10.5)
nRBC: 0 % (ref 0.0–0.2)

## 2022-10-11 LAB — GASTROINTESTINAL PANEL BY PCR, STOOL (REPLACES STOOL CULTURE)

## 2022-10-11 LAB — APTT: aPTT: 30 seconds (ref 24–36)

## 2022-10-11 LAB — C DIFFICILE QUICK SCREEN W PCR REFLEX
C Diff antigen: NEGATIVE
C Diff interpretation: NOT DETECTED
C Diff toxin: NEGATIVE

## 2022-10-11 LAB — PROCALCITONIN: Procalcitonin: 42.29 ng/mL

## 2022-10-11 LAB — CBG MONITORING, ED: Glucose-Capillary: 63 mg/dL — ABNORMAL LOW (ref 70–99)

## 2022-10-11 MED ORDER — SODIUM CHLORIDE 0.9 % IV SOLN: 2.0000 g | INTRAVENOUS | Status: DC

## 2022-10-11 MED ORDER — SODIUM CHLORIDE 0.9 % IV SOLN
250.0000 mL | INTRAVENOUS | Status: DC
  Administered 2022-10-11: 250 mL via INTRAVENOUS

## 2022-10-11 MED ORDER — ACETAMINOPHEN 10 MG/ML IV SOLN
INTRAVENOUS | Status: AC
  Filled 2022-10-11: qty 100

## 2022-10-11 MED ORDER — MIDAZOLAM HCL 2 MG/2ML IJ SOLN
INTRAMUSCULAR | Status: AC
  Administered 2022-10-11: 2 mg
  Filled 2022-10-11: qty 2

## 2022-10-11 MED ORDER — MIDAZOLAM HCL 2 MG/2ML IJ SOLN: 1.0000 mg | INTRAMUSCULAR | Status: DC | PRN

## 2022-10-11 MED ORDER — PANTOPRAZOLE SODIUM 40 MG IV SOLR
40.0000 mg | Freq: Every day | INTRAVENOUS | Status: DC
  Administered 2022-10-11: 40 mg via INTRAVENOUS
  Filled 2022-10-11: qty 10

## 2022-10-11 MED ORDER — HYDROMORPHONE HCL 1 MG/ML IJ SOLN: 0.5000 mg | INTRAMUSCULAR | Status: DC | PRN

## 2022-10-11 MED ORDER — ORAL CARE MOUTH RINSE
15.0000 mL | OROMUCOSAL | Status: DC
  Administered 2022-10-11 (×4): 15 mL via OROMUCOSAL

## 2022-10-11 MED ORDER — GLYCOPYRROLATE 0.2 MG/ML IJ SOLN
INTRAMUSCULAR | Status: AC
  Filled 2022-10-11: qty 1

## 2022-10-11 MED ORDER — PHENYLEPHRINE HCL-NACL 20-0.9 MG/250ML-% IV SOLN
25.0000 ug/min | INTRAVENOUS | Status: DC
  Administered 2022-10-11: 70 ug/min via INTRAVENOUS
  Administered 2022-10-11: 100 ug/min via INTRAVENOUS
  Filled 2022-10-11 (×2): qty 250

## 2022-10-11 MED ORDER — NOREPINEPHRINE 4 MG/250ML-% IV SOLN: 2.0000 ug/min | INTRAVENOUS | Status: DC

## 2022-10-11 MED ORDER — NOREPINEPHRINE 16 MG/250ML-% IV SOLN
0.0000 ug/min | INTRAVENOUS | Status: DC
  Administered 2022-10-11: 5 ug/min via INTRAVENOUS
  Filled 2022-10-11: qty 250

## 2022-10-11 MED ORDER — ONDANSETRON HCL 4 MG/2ML IJ SOLN: 4.0000 mg | Freq: Four times a day (QID) | INTRAMUSCULAR | Status: DC | PRN

## 2022-10-11 MED ORDER — SODIUM CHLORIDE 0.9 % IV SOLN
0.5000 ug/min | INTRAVENOUS | Status: DC
  Administered 2022-10-11: 5 ug/min via INTRAVENOUS
  Filled 2022-10-11 (×2): qty 10

## 2022-10-11 MED ORDER — ACETAMINOPHEN 500 MG PO TABS
1000.0000 mg | ORAL_TABLET | Freq: Four times a day (QID) | ORAL | Status: DC
  Administered 2022-10-11: 1000 mg
  Filled 2022-10-11: qty 2

## 2022-10-11 MED ORDER — VASOPRESSIN 20 UNITS/100 ML INFUSION FOR SHOCK
0.0000 [IU]/min | INTRAVENOUS | Status: DC
  Administered 2022-10-11: 0.03 [IU]/min via INTRAVENOUS
  Filled 2022-10-11: qty 100

## 2022-10-11 MED ORDER — ONDANSETRON HCL 4 MG/2ML IJ SOLN
INTRAMUSCULAR | Status: AC
  Filled 2022-10-11: qty 2

## 2022-10-11 MED ORDER — MIDAZOLAM HCL 2 MG/2ML IJ SOLN
1.0000 mg | INTRAMUSCULAR | Status: DC | PRN
  Administered 2022-10-11: 1 mg via INTRAVENOUS
  Filled 2022-10-11: qty 2

## 2022-10-11 MED ORDER — LACTATED RINGERS IV BOLUS
1000.0000 mL | Freq: Once | INTRAVENOUS | Status: AC
  Administered 2022-10-11: 1000 mL via INTRAVENOUS

## 2022-10-11 MED ORDER — METRONIDAZOLE 500 MG/100ML IV SOLN
500.0000 mg | Freq: Two times a day (BID) | INTRAVENOUS | Status: DC
  Administered 2022-10-11: 500 mg via INTRAVENOUS
  Filled 2022-10-11 (×2): qty 100

## 2022-10-11 MED ORDER — POTASSIUM CHLORIDE 10 MEQ/100ML IV SOLN
10.0000 meq | INTRAVENOUS | Status: AC
  Administered 2022-10-11 (×4): 10 meq via INTRAVENOUS
  Filled 2022-10-11 (×4): qty 100

## 2022-10-11 MED ORDER — GLYCOPYRROLATE 1 MG PO TABS: 1.0000 mg | ORAL_TABLET | ORAL | Status: DC | PRN

## 2022-10-11 MED ORDER — SODIUM CHLORIDE 0.9 % IV SOLN
2.0000 g | Freq: Three times a day (TID) | INTRAVENOUS | Status: DC
  Administered 2022-10-11: 2 g via INTRAVENOUS
  Filled 2022-10-11 (×2): qty 12.5
  Filled 2022-10-11: qty 2

## 2022-10-11 MED ORDER — LORAZEPAM 2 MG/ML IJ SOLN
INTRAMUSCULAR | Status: AC
  Administered 2022-10-11: 2 mg via INTRAVENOUS
  Filled 2022-10-11: qty 1

## 2022-10-11 MED ORDER — SODIUM CHLORIDE 0.9 % IV SOLN: INTRAVENOUS | Status: DC

## 2022-10-11 MED ORDER — GLYCOPYRROLATE 0.2 MG/ML IJ SOLN: 0.2000 mg | INTRAMUSCULAR | Status: DC | PRN

## 2022-10-11 MED ORDER — FENTANYL 2500MCG IN NS 250ML (10MCG/ML) PREMIX INFUSION
25.0000 ug/h | INTRAVENOUS | Status: DC
  Administered 2022-10-11: 100 ug/h via INTRAVENOUS

## 2022-10-11 MED ORDER — IPRATROPIUM-ALBUTEROL 0.5-2.5 (3) MG/3ML IN SOLN: 3.0000 mL | Freq: Four times a day (QID) | RESPIRATORY_TRACT | Status: DC | PRN

## 2022-10-11 MED ORDER — FENTANYL BOLUS VIA INFUSION
25.0000 ug | INTRAVENOUS | Status: DC | PRN
  Administered 2022-10-11: 75 ug via INTRAVENOUS

## 2022-10-11 MED ORDER — DEXTROSE 50 % IV SOLN
INTRAVENOUS | Status: AC
  Administered 2022-10-11: 50 mL
  Filled 2022-10-11: qty 50

## 2022-10-11 MED ORDER — POLYVINYL ALCOHOL 1.4 % OP SOLN: 1.0000 [drp] | Freq: Four times a day (QID) | OPHTHALMIC | Status: DC | PRN

## 2022-10-11 MED ORDER — ACETAMINOPHEN 10 MG/ML IV SOLN
INTRAVENOUS | Status: DC | PRN
  Administered 2022-10-11: 1000 mg via INTRAVENOUS

## 2022-10-11 MED ORDER — METRONIDAZOLE 500 MG/100ML IV SOLN
500.0000 mg | Freq: Two times a day (BID) | INTRAVENOUS | Status: DC
  Filled 2022-10-11: qty 100

## 2022-10-11 MED ORDER — ORAL CARE MOUTH RINSE: 15.0000 mL | OROMUCOSAL | Status: DC | PRN

## 2022-10-11 MED ORDER — PROPOFOL 1000 MG/100ML IV EMUL
0.0000 ug/kg/min | INTRAVENOUS | Status: DC
  Administered 2022-10-11: 5 ug/kg/min via INTRAVENOUS
  Filled 2022-10-11 (×2): qty 100

## 2022-10-11 MED ORDER — DOCUSATE SODIUM 50 MG/5ML PO LIQD
100.0000 mg | Freq: Two times a day (BID) | ORAL | Status: DC
  Administered 2022-10-11: 100 mg
  Filled 2022-10-11: qty 10

## 2022-10-11 MED ORDER — ONDANSETRON 4 MG PO TBDP: 4.0000 mg | ORAL_TABLET | Freq: Four times a day (QID) | ORAL | Status: DC | PRN

## 2022-10-11 MED ORDER — NOREPINEPHRINE 4 MG/250ML-% IV SOLN
2.0000 ug/min | INTRAVENOUS | Status: DC
  Administered 2022-10-11: 10 ug/min via INTRAVENOUS

## 2022-10-11 MED ORDER — POLYETHYLENE GLYCOL 3350 17 G PO PACK: 17.0000 g | PACK | Freq: Every day | ORAL | Status: DC

## 2022-10-11 MED ORDER — NOREPINEPHRINE 4 MG/250ML-% IV SOLN
INTRAVENOUS | Status: AC
  Filled 2022-10-11: qty 250

## 2022-10-11 MED ORDER — LORAZEPAM 2 MG/ML IJ SOLN: 2.0000 mg | INTRAMUSCULAR | Status: DC | PRN

## 2022-10-11 MED ORDER — GLYCOPYRROLATE 0.2 MG/ML IJ SOLN
0.2000 mg | INTRAMUSCULAR | Status: DC | PRN
  Administered 2022-10-11: 0.2 mg via INTRAVENOUS

## 2022-10-12 ENCOUNTER — Encounter: Payer: Self-pay | Admitting: Surgery

## 2022-10-13 LAB — SURGICAL PATHOLOGY

## 2022-10-14 NOTE — Procedures (Signed)
Central Venous Catheter Insertion Procedure Note  Paige Parker  [id number]  [birth year]  Date:09/25/2022  Time:7:33 AM   Provider Performing:Alexina Niccoli A Stark Klein   Procedure: Insertion of Non-tunneled Central Venous Catheter(36556) with US guidance (29924)   Indication(s) Medication administration and Difficult access  Consent Unable to obtain consent due to emergent nature of procedure.  Anesthesia Topical only with 1% lidocaine   Timeout Verified patient identification, verified procedure, site/side was marked, verified correct patient position, special equipment/implants available, medications/allergies/relevant history reviewed, required imaging and test results available.  Sterile Technique Maximal sterile technique including full sterile barrier drape, hand hygiene, sterile gown, sterile gloves, mask, hair covering, sterile ultrasound probe cover (if used).  Procedure Description Area of catheter insertion was cleaned with chlorhexidine and draped in sterile fashion.  With real-time ultrasound guidance a central venous catheter was placed into the left internal jugular vein. Nonpulsatile blood flow and easy flushing noted in all ports.  The catheter was sutured in place and sterile dressing applied.  Complications/Tolerance None; patient tolerated the procedure well. Chest X-ray is ordered to verify placement for internal jugular or subclavian cannulation.   Chest x-ray is not ordered for femoral cannulation.  EBL Minimal  Specimen(s) None   Rufina Falco, DNP, CCRN, FNP-C, AGACNP-BC Acute Care & Family Nurse Practitioner  [facility]  See Amion for personal pager PCCM on call pager [phone] until 7 am

## 2022-10-14 NOTE — Procedures (Signed)
Arterial Catheter Insertion Procedure Note  ALORIA LOOPER  [id number]  Oct 10, 1936  Date:10/08/2022  Time:9:37 AM    Provider Performing: Berdine Addison Jeanell Mangan    Procedure: Insertion of Arterial Line [phone]) with US guidance ([id number])   Indication(s) Blood pressure monitoring and/or need for frequent ABGs  Consent Risks of the procedure as well as the alternatives and risks of each were explained to the patient and/or caregiver.  Consent for the procedure was obtained and is signed in the bedside chart  Anesthesia None   Time Out Verified patient identification, verified procedure, site/side was marked, verified correct patient position, special equipment/implants available, medications/allergies/relevant history reviewed, required imaging and test results available.   Sterile Technique Maximal sterile technique including full sterile barrier drape, hand hygiene, sterile gown, sterile gloves, mask, hair covering, sterile ultrasound probe cover (if used).   Procedure Description Area of catheter insertion was cleaned with chlorhexidine and draped in sterile fashion. With real-time ultrasound guidance an arterial catheter was placed into the right radial artery.  Appropriate arterial tracings confirmed on monitor.     Complications/Tolerance None; patient tolerated the procedure well.   EBL Minimal   Specimen(s) None   Armando Reichert, MD [facility] 09/25/2022 9:37 AM

## 2022-10-14 NOTE — IPAL (Signed)
  Interdisciplinary Goals of Care Family Meeting   Date carried out: Nov 10, 2022  Location of the meeting: Bedside  Member's involved: Physician, Bedside Registered Nurse, and Family Member or next of kin  Durable Power of Attorney or acting medical decision maker: 2 daughters at the bedside    Discussion: We discussed goals of care for Paige Parker. Discussed clinical deterioration despite maximal care, over all extremely poor prognosis. They are at peace the end of life situation given septic shock and renal failure. Patient previously stated wanting DNR to them and not wanting life prolonging care. Will proceed with terminal extubation and re-orient goals of care towards comfort measures.  Code status: Full DNR, Comfort measures  Disposition: In-patient comfort care  Time spent for the meeting: 73 minutes   Armando Reichert, MD  11/10/22, 1:32 PM

## 2022-10-14 NOTE — Progress Notes (Signed)
PHARMACY NOTE:  ANTIMICROBIAL RENAL DOSAGE ADJUSTMENT  Current antimicrobial regimen includes a mismatch between antimicrobial dosage and estimated renal function.  As per policy approved by the Pharmacy & Therapeutics and Medical Executive Committees, the antimicrobial dosage will be adjusted accordingly.  Current antimicrobial dosage: Cefepime 2 g IV q8h  Indication: IAI  Renal Function:  Estimated Creatinine Clearance: 12.8 mL/min (A) (by C-G formula based on SCr of 3.67 mg/dL (H)).    Antimicrobial dosage has been changed to:  Cefepime 2 g IV q24h  Additional comments: AKI improving, continue to monitor for further adjustments  Thank you for allowing pharmacy to be a part of this patient's care.  Benita Gutter, [facility] 11-07-22 8:05 AM

## 2022-10-14 NOTE — IPAL (Signed)
  Interdisciplinary Goals of Care Family Meeting   Date carried out: 2022/10/30  Location of the meeting: Bedside  Member's involved: Physician and Family Member or next of kin (daughter)  Durable Power of Forensic psychologist or Loss adjuster, chartered: Daughter    Discussion: We discussed goals of care for Paige Parker . Explained guarded medical condition with progressive shock unresponsive to pressors. Daughter understands poor prognosis in the setting of septic shock secondary to bowel perforation and peritonitis s/p resection. They reported the patient had advanced directives wanting to be DNR. Code status changed in the computer. Patient's other daughter is on her way to [facility] from [place] and arrives around noon.  Code status: Full DNR  Disposition: Continue current acute care, no resuscitation in case of cardiac arrest  Time spent for the meeting: 15 minutes    Armando Reichert, MD  2022/10/30, 9:52 AM

## 2022-10-14 NOTE — Consult Note (Addendum)
NAME:  Paige Parker, MRN:  [id number], DOB:  1936-03-25, LOS: 0 ADMISSION DATE:  09/18/2022, CONSULTATION DATE:  09/28/2022 REFERRING MD:  Campbell Lerner  CHIEF COMPLAINT:  Altered Mental Status   HPI  86 y.o with significant PMH of COPD,former smoker, B12 deficiency, arthritis, Aortic Aneurysm, hypothyroidism, dementia, hypertension, GERD, basal cell carcinoma and iron deficiency anemia who presented to the ED with chief complaints of altered mental status, speech changes, diarrhea and generalized weakness.  On chart review, she was recently admitted to the hospital on 07/30/2022 with acute blood loss anemia secondary to upper GI bleed. 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 Patient was seen by her PCP for suspected UTI and was started on antibiotics for one day.   ED Course: Initial vital signs showed HR of  68 beats/minute, BP 82/54 mm Hg, the RR 21 breaths/minute, and the oxygen saturation 90 % on and a temperature of 97.56F (36.3C).   Pertinent Labs/Diagnostics Findings: Chemistry:Na+/ K+: 138/3.5 Glucose:81  BUN/Cr:  91/4.37 CBC: WBC:3.9  Hgb/Hct: 10.6/39.8  Other Lab findings: Lactic acid:1.3~ 1.6   Imaging:  CXR> Large amount of free intraperitoneal air in the upper abdomen. SBO. CTH>No acute intracranial abnormality CT Abd/pelvis>Large pneumoperitoneum with findings concerning for bowel ischemia, likely secondary to obstruction in the left inguinal hernia  Patient given 30 cc/kg of fluids and started on broad-spectrum antibiotics Vanco cefepime and Flagyl for suspected sepsis secondary to intrabdominal source. General surgery was consulted STAT and patient taken urgently to OR for repair of Perforated viscus, bilateral incarcerated inguinal hernias. Patient remained hypotensive post OP requiring pressors and mechanical ventilation. (Sepsis reassessment  completed). PCCM consulted.  Past Medical History  COPD,former smoker, B12 deficiency, arthritis, Aortic Aneurysm, hypothyroidism, dementia, hypertension, GERD, basal cell carcinoma and iron deficiency anemia  Significant Hospital Events   10/28:Presented with AMS, diarrhea and weakness found to have Perforated viscus, bilateral incarcerated inguinal hernias. Taken to OR emergently for repair 10/29:Admitted to ICU post op s/p Right hemicolectomy with end ileostomy, extensive lysis of adhesions. PCCM consulted for vent  and pressor management  Consults:  PCCM  Procedures:  10/28: Right hemicolectomy with end ileostomy, extensive lysis of adhesions  Significant Diagnostic Tests:  10/28: Chest Xray>1. Large amount of free intraperitoneal air in the upper abdomen. 2. Prominent/dilated small bowel loops in the upper abdomen, concerning for small bowel obstruction. 10/28: Noncontrast CT head>No acute intracranial pathology. Small-vessel white matter disease. 10/28: CTA abdomen and pelvis>1. Large pneumoperitoneum with findings concerning for bowel ischemia, likely secondary to obstruction in the left inguinal hernia. Surgical consult is advised. 2. Severe diffuse colonic diverticulosis. 3. Left retroperitoneal adenopathy, likely reactive. 4.  Aortic Atherosclerosis (ICD10-I70.0).  Micro Data:  10/28: SARS-CoV-2 PCR> negative 10/28: Influenza PCR> negative 10/28: Blood culture x2> 10/28: Urine Culture> 10/28: MRSA PCR>>   Antimicrobials:  Vancomycin 10/28> Cefepime 10/28> Metronidazole 10/28>  OBJECTIVE  Blood pressure (!) 102/51, pulse 89, temperature (!) 94.5 F (34.7 C), resp. rate 18, height 5\' 11"  (1.803 m), weight 72.6 kg, SpO2 90 %.    Vent Mode: PRVC FiO2 (%):  [50 %] 50 % Set Rate:  [18 bmp] 18 bmp Vt Set:  [400 mL] 400 mL PEEP:  [5 cmH20] 5 cmH20 Plateau Pressure:  [13 cmH20] 13 cmH20   Intake/Output Summary (Last 24 hours) at 10/03/2022 0155 Last data filed at  10/09/2022 0044 Gross per 24 hour  Intake 3500 ml  Output 530 ml  Net 2970 ml   Filed Weights   10-14-22 1812  Weight: 72.6 kg   Physical Examination  GENERAL:86  year-old critically ill patient lying in the bed INTUBATED & SEDATED EYES: Pupils equal, round, reactive to light and accommodation. No scleral icterus. Extraocular muscles intact.  HEENT: Head atraumatic, normocephalic. Oropharynx and nasopharynx clear.  NECK:  Supple, no jugular venous distention. No thyroid enlargement, no tenderness.  LUNGS: Normal breath sounds bilaterally, no wheezing, rales,rhonchi or crepitation. No use of accessory muscles of respiration.  CARDIOVASCULAR: S1, S2 normal. Murmurs present. rubs, or gallops.  ABDOMEN: Soft, nontender, nondistended. Bowel sounds absent. No organomegaly or mass. Mid abdomen incision with honeycomb dressing dry and intact. JP drain. EXTREMITIES: Upper and lower extremities are atraumatic in appearance without tenderness or deformity. No swelling or erythema. Unable to assess Full range of motion or muscle strength. Capillary refill is less than 3 seconds in all extremities. Pulses palpable. Extremities are cool to touch. NEUROLOGIC:The patient is INTUBATED & SEDATED. Unable to assess motor function. Sensation is intact bilaterally. Reflexes 2+ bilaterally. Cranial nerves are intact. Gait not checked.  PSYCHIATRIC: The patient is INTUBATED & SEDATED SKIN: No obvious rash, lesion, or ulcer.   Labs/imaging that I havepersonally reviewed  (right click and "Reselect all SmartList Selections" daily)     Labs   CBC: Recent Labs  Lab 10-14-2022 2015  WBC 3.9*  NEUTROABS 3.3  HGB 10.6*  HCT 39.8  MCV 87.7  PLT 867    Basic Metabolic Panel: Recent Labs  Lab 14-Oct-2022 2015  NA 138  K 3.5  CL 102  CO2 15*  GLUCOSE 81  BUN 91*  CREATININE 4.37*  CALCIUM 7.3*   GFR: Estimated Creatinine Clearance: 10.7 mL/min (A) (by C-G formula based on SCr of 4.37 mg/dL  (H)). Recent Labs  Lab Oct 14, 2022 2015 Oct 14, 2022 2032  WBC 3.9*  --   LATICACIDVEN  --  1.3    Liver Function Tests: Recent Labs  Lab 10/14/22 2015  AST 37  ALT 8  ALKPHOS 72  BILITOT 0.7  PROT 6.6  ALBUMIN 2.7*   No results for input(s): "LIPASE", "AMYLASE" in the last 168 hours. No results for input(s): "AMMONIA" in the last 168 hours.  ABG No results found for: "PHART", "PCO2ART", "PO2ART", "HCO3", "TCO2", "ACIDBASEDEF", "O2SAT"   Coagulation Profile: No results for input(s): "INR", "PROTIME" in the last 168 hours.  Cardiac Enzymes: Recent Labs  Lab 14-Oct-2022 2145  CKTOTAL 335*    HbA1C: No results found for: "HGBA1C"  CBG: Recent Labs  Lab 10/12/2022 0101  GLUCAP 63*    Review of Systems:   UNABLE TO OBTAIN PATIENT IS CURRENTLY INTUBATED AND SEDATED  Past Medical History  She,  has a past medical history of Aortic aneurysm ([place]), COPD (chronic obstructive pulmonary disease) ([place]), Dementia ([place]), and Heart murmur.   Surgical History    Past Surgical History:  Procedure Laterality Date   COLONOSCOPY     ESOPHAGOGASTRODUODENOSCOPY     ESOPHAGOGASTRODUODENOSCOPY (EGD) WITH PROPOFOL N/A 07/31/2022   Procedure: ESOPHAGOGASTRODUODENOSCOPY (EGD) WITH PROPOFOL;  Surgeon: Lucilla Lame, MD;  Location: [facility] ENDOSCOPY;  Service: Endoscopy;  Laterality: N/A;   FRACTURE SURGERY       Social History   reports that she has quit smoking. Her smoking use included cigarettes. She has a 63.00 pack-year smoking history. She does not have any smokeless tobacco history on file. She reports that she does not currently use alcohol. She  reports that she does not use drugs.   Family History   Her family history is not on file.   Allergies No Known Allergies   Home Medications  Prior to Admission medications   Medication Sig Start Date End Date Taking? Authorizing Provider  atorvastatin (LIPITOR) 10 MG tablet Take 10 mg by mouth daily. 06/03/22   [provider]   Cyanocobalamin (VITAMIN B12) 500 MCG TABS Take 500 mcg by mouth daily in the afternoon.    [provider]  donepezil (ARICEPT) 5 MG tablet Take 5 mg by mouth at bedtime. 06/03/22   [provider]  ferrous sulfate 325 (65 FE) MG tablet Take 325 mg by mouth daily.    [provider]  folic acid (FOLVITE) 400 MCG tablet Take 400 mcg by mouth daily.    [provider]  levothyroxine (SYNTHROID) 112 MCG tablet TAKE 1 TABLET (112 MCG TOTAL) BY MOUTH ONCE DAILY FOR 180 DAYS TAKE ON AN EMPTY STOMACH WITH A GLASS OF WATER AT LEAST 30-60 MINUTES BEFORE BREAKFAST. 06/25/22 12/22/22  [provider]  LINZESS 72 MCG capsule Take 1 capsule (72 mcg total) by mouth daily as needed. 08/01/22   Wouk, Wilfred Curtis, MD  losartan (COZAAR) 25 MG tablet Take 25 mg by mouth daily. 07/09/22   [provider]  memantine (NAMENDA) 10 MG tablet Take 10 mg by mouth 2 (two) times daily. 07/09/22   [provider]  verapamil (CALAN) 40 MG tablet Take 40 mg by mouth 2 (two) times daily. 06/03/22   [provider]  Scheduled Meds:  acetaminophen  1,000 mg Per Tube Q6H   docusate  100 mg Per Tube BID   pantoprazole (PROTONIX) IV  40 mg Intravenous QHS   polyethylene glycol  17 g Per Tube Daily   Continuous Infusions:  sodium chloride 100 mL/hr at 10/26/2022 0224   sodium chloride 250 mL (October 26, 2022 0200)   ceFEPime (MAXIPIME) IV     And   metronidazole 500 mg (10/26/2022 0217)   fentaNYL infusion INTRAVENOUS 100 mcg/hr (2022/10/26 0138)   lactated ringers     phenylephrine (NEO-SYNEPHRINE) Adult infusion 70 mcg/min (October 26, 2022 0232)   propofol (DIPRIVAN) infusion 5 mcg/kg/min (2022-10-26 0149)   PRN Meds:.fentaNYL, HYDROmorphone (DILAUDID) injection, ondansetron **OR** ondansetron (ZOFRAN) IV   Active Hospital Problem list      Assessment & Plan:  Acute Bowel Perforation Secondary to Bilateral Incarcerated Inguinal Hernia S/p Ex Lap with repair of Perforated  cecum, bilateral incarcerated inguinal hernias POD # 0 -Keep NPO  -Hemodynamic support  -NG tube to intermittent suction -IV broad-spectrum antibiotics  -Wound care per general surgery -Management per General surgery   Acute hypoxic respiratory failure Postop respiratory failure PMHx: COPD, Former smoker -Remains on full vent support with FIO2 40 and PEEP 5.   -Continue lung protective ventilation -VAP bundle in place -Intermittent chest x-ray & ABG -Minimize sedation with RASS goal -1 -SBT when more awake -PRN bronchodilators  Septic Shock Secondary to Perorated Viscus -F/u cultures, trend lactic/ PCT -Monitor WBC/ fever curve -Continue broad spectrum IV antibiotics:  -IVF hydration as needed -Pressors for MAP goal >65 -Strict I/O's   AKI due to ischemic ATN ~IMPROVING Acute Metabolic Acidosis Hypokalemia -Monitor I&O's / urinary output -Follow BMP -Ensure adequate renal perfusion -Avoid nephrotoxic agents as able -Replace electrolytes as indicated  Acute Septic/Metabolic Encephalopathy  Hx: Dementia -Provide supportive care -Promote normal sleep/wake cycle -Avoid sedating meds as able -CT Head  negative for acute intracranial abnormality  Anemia Hx IDA, recent Acute blood loss Anemia due to bleeding angiodysplatic lesion s/p repair -Hgb drop from 10.6 to 8.6, likely post op related -Follow H&H -Transfuse for hgb<7   Best practice:  Diet:  NPO Pain/Anxiety/Delirium protocol (if indicated): Yes (RASS goal 0) VAP protocol (if indicated): Yes DVT prophylaxis: Contraindicated GI prophylaxis: PPI Glucose control:  SSI No Central venous access:  N/A Arterial line:  N/A Foley:  Yes, and it is still needed Mobility:  bed rest  PT consulted: N/A Last date of multidisciplinary goals of care discussion [09/28/2022] Code Status:  full code Disposition: ICU   = Goals of Care = Code Status Order: FULL  Primary Emergency Contact: douglas,susan Wishes to pursue  full aggressive treatment and intervention options, including CPR and intubation, but goals of care will be addressed on going with family if that should become necessary.   Critical care time: 45 minutes       Webb Silversmith, DNP, CCRN, FNP-C, AGACNP-BC Acute Care Nurse Practitioner [facility]  PCCM on call pager [phone] until 7 am

## 2022-10-14 NOTE — Progress Notes (Signed)
Pt at max pressors, and continues to deteriorate, despite max efforts.   Unfortunately the operation did not reverse her septic course.   Spoke with Daughter at bedside, at peace with w/d / limiting care.  Awaiting other sister's presence.   I remain available to assist in any way.    Appreciate CC team's care.

## 2022-10-14 NOTE — Progress Notes (Signed)
Time of death 1527  Patient was surrounded by her 2 daughters and son-in-law. They were able to share fond memories and beloved stories. She passed peacefully with no distress and was clearly greatly loved. Thank you to all those involved in her care. The family expressed great appreciation.

## 2022-10-14 NOTE — Progress Notes (Signed)
An USGPIV (ultrasound guided PIV) has been placed for short-term vasopressor infusion. A correctly placed ivWatch must be used when administering Vasopressors. Should this treatment be needed beyond 72 hours, central line access should be obtained.  It will be the responsibility of the bedside nurse to follow best practice to prevent extravasations.   ?

## 2022-10-14 NOTE — Op Note (Signed)
Right hemicolectomy with end ileostomy, extensive lysis of adhesions  Pre-operative Diagnosis: Perforated viscus, bilateral incarcerated inguinal hernias.  Post-operative Diagnosis: Perforated cecum, bilateral incarcerated inguinal hernias,   Surgeon: Campbell Lerner, M.D., [facility]  Anesthesia: General endotracheal  Findings: extensive/chronic pelvic scarring from presumed diverticular disease, and chronic right lower quadrant scarring from likely incarcerated right inguinal hernia.  Focal gangrenous changes to cecum with perforation.  Estimated Blood Loss: 500 mL         Specimens: Right colon          Complications: none              Procedure Details  The patient was seen again in the Holding Room. The benefits, complications, treatment options, and expected outcomes were discussed with the patient. The risks of bleeding, infection, recurrence of symptoms, failure to resolve symptoms, unanticipated injury, prosthetic placement, prosthetic infection, any of which could require further surgery were reviewed with the patient. The likelihood of improving the patient's symptoms with return to their baseline status is guarded.  The patient and/or family concurred with the proposed plan, giving informed consent.  The patient was taken to Operating Room, identified and the procedure verified.    Prior to the induction of general anesthesia, antibiotic prophylaxis was administered. VTE prophylaxis was in place.  General anesthesia was then administered and tolerated adequately. After the induction, the patient was positioned in the supine position, her hernias were easily reduced, a Foley is placed, and the abdomen was prepped with  Chloraprep and draped in the sterile fashion.  A Time Out was held and the above information confirmed.  Midline incision is made through the skin and carried through subcutaneous tissues with electrosurgery.  The linea alba was carefully incised and peritoneum sharply  opened, free air allowed to escape. Upon entry of the peritoneum there was purulent peritoneal fluid diffusely.  I began by evaluating the small bowel high identifying a remarkable twisting pattern, and associated adhesions deep in the pelvis.  Some loops of small bowel were so densely adherent to the sigmoid colon that I could not clearly identify where the sigmoid colon was.  I worked through the loops distally, and identified the perforation in the cecum.  Knowing we would have to resect this portion, and anticipating a possible reanastomosis, I began by mobilizing the right colon from its lateral peritoneal attachments.  These lateral peritoneal attachments around the cecal area were densely scarred, and I suspect this was part of a sliding hernia.  Once the distal ileum was fully mobilized through the proximal transverse colon, I made a mesenteric window and using the LigaSure proceeded with mesenteric division.  A GIA 55 stapler was utilized to divide the distal ileum and the colon just proximal to the middle colic vasculature. Specimen was then removed from the field.  Then spent way too much time mobilizing the small bowel from the remainder of the pelvis.  Identified a discolored area on the side of the sigmoid colon, I suspect there may have been an abscess there at one time.  The colon itself appears intact, it is extremely thick with chronic edema/inflammatory changes.  I felt it prudent not to pursue any definitive management of this region. I also felt wise not to pursue reanastomosis.  I decided then to break up an end ileostomy and right lower quadrant.  We irrigated abdominal cavity with multiple aliquots of warm normal saline.  Tolerated some oozing in the pelvis from all the adhesiolysis. She had 2  widely patent inguinal rings which I felt prudent to provide some form of resistance to immediate reincarceration.  Therefore I proceeded with sewing of the peritoneal ring/opening closed at least  temporarily obliterated during her convalescence should she survive. Placed a 69 Blake drain in the pelvis amongst the scarred rectosigmoid region. I then made a small opening in the right abdominal wall, sufficient to pass the distal ileum.  I ensure there were no twists or kinks and that laid well as I withdrew it out the stoma site. We then reapproximated the midline fascia with running 0 PDS.  She had no omentum to protect her midline incision.  Her skin was closed loosely with staples.  I then opened and matured her ileostomy. Her drain was secured with 3-0 nylon.  Drain dressings, midline honeycomb and stoma appliance applied  She remained intubated and was transferred to ICU bed 1 in guarded condition.     Ronny Bacon M.D., [facility] 10/09/2022 1:08 AM

## 2022-10-14 NOTE — Anesthesia Postprocedure Evaluation (Signed)
Anesthesia Post Note  Patient: Paige Parker  Procedure(s) Performed: LAPAROTOMY COLON RESECTION (Right) COLOSTOMY (Right)  Patient location during evaluation: SICU Anesthesia Type: General Level of consciousness: sedated Pain management: pain level controlled Vital Signs Assessment: post-procedure vital signs reviewed and stable Respiratory status: patient remains intubated per anesthesia plan Cardiovascular status: unstable Postop Assessment: no apparent nausea or vomiting Anesthetic complications: no   No notable events documented.   Last Vitals:  Vitals:   11-07-22 0515 2022-11-07 0545  BP: (!) 76/53 (!) 81/57  Pulse:    Resp: 18 18  Temp: (!) 35.6 C (!) 35.8 C  SpO2:      Last Pain:  Vitals:   11-07-2022 0145  TempSrc: Esophageal  PainSc:                  Paige Parker

## 2022-10-14 NOTE — Transfer of Care (Addendum)
Immediate Anesthesia Transfer of Care Note  Patient: Paige Parker  Procedure(s) Performed: LAPAROTOMY COLON RESECTION (Right) COLOSTOMY (Right)  Patient Location: PACU and ICU  Anesthesia Type:General  Level of Consciousness: Patient remains intubated per anesthesia plan  Airway & Oxygen Therapy: Patient remains intubated per anesthesia plan  Post-op Assessment: Report given to RN  Post vital signs: stable  Last Vitals:  Vitals Value Taken Time  BP    Temp    Pulse    Resp 18 10/12/2022 0118  SpO2    Vitals shown include unvalidated device data.  Last Pain:  Vitals:   Oct 21, 2022 1812  TempSrc:   PainSc: 0-No pain         Complications: No notable events documented.

## 2022-10-14 NOTE — Death Summary Note (Signed)
DEATH SUMMARY   Patient Details  Name: Paige Parker MRN: [id number] DOB: [birth year]  Admission/Discharge Information   Admit Date:  11/07/22  Date of Death: Date of Death: 11/08/22  Time of Death: Time of Death: 04/15/26  Length of Stay: 0  Referring Physician: Tracie Harrier, MD   Reason(s) for Hospitalization  Septic shock secondary to perforated viscus.  Diagnoses  Preliminary cause of death:  Secondary Diagnoses (including complications and co-morbidities):  Principal Problem:   Perforated abdominal viscus Active Problems:   Acute abdomen   Septic shock (HCC)   AKI (acute kidney injury) ([place])   Acute respiratory failure with hypoxia and hypercapnia [facility])   Brief Hospital Course (including significant findings, care, treatment, and services provided and events leading to death)  Paige Parker is a 86 y.o. year old female who presented to the hospital with abdominal pain found to have pneumoperitoneum secondary to perforated viscus. She was taken emergently to the OR for laparotomy. Bowel resected and ileostomy created following finding of perforation in the cecum and pus in the peritoneum. She was admitted to the ICU following surgery and was noted to have progressive septic shock, respiratory failure (hypoxic and hypercapnic), acidemia, and renal failure. Despite maximal ventilatory and vasopressor support, her hemodynamics worsened. She was on nor-epinephrine, vasopressin, and epinephrine with worsening hemodynamics. Goals of care conversation with daughters at the bedside yielded change of goals of care to comfort. Patient was terminally extubated and passed away peacefully in the presence of her loving family.   Pertinent Labs and Studies  Significant Diagnostic Studies DG Chest Port 1 View  Result Date: Nov 08, 2022 CLINICAL DATA:  86 year old female status post central line placement. EXAM: PORTABLE CHEST 1 VIEW COMPARISON:  Chest x-ray 2022-11-08. FINDINGS: An  endotracheal tube is in place with tip 7.6 cm above the carina. A nasogastric tube is seen extending into the stomach, however, the tip of the nasogastric tube extends below the lower margin of the image. Esophageal temperature probe also noted. Left-sided internal jugular central venous catheter with tip terminating in the mid superior vena cava. Lung volumes are normal. Bibasilar opacities which may reflect areas of atelectasis and/or consolidation. No definite pleural effusions. No pneumothorax. No evidence of pulmonary edema. Interstitial prominence and peribronchial cuffing noted throughout the mid to lower lungs bilaterally. Severe dilatation of the central pulmonary arteries. Heart size is normal. Atherosclerotic calcifications in the thoracic aorta. IMPRESSION: 1. Support apparatus, as above. 2. Atelectasis and/or consolidation in the lung bases bilaterally. 3. Severe dilatation of the central pulmonary arteries, concerning for pulmonary arterial hypertension. 4. Aortic atherosclerosis. Electronically Signed   By: Vinnie Langton M.D.   On: 11-08-22 07:26   DG Chest Port 1 View  Result Date: 11/08/22 CLINICAL DATA:  Respiratory failure EXAM: PORTABLE CHEST 1 VIEW COMPARISON:  Nov 07, 2022 FINDINGS: Endotracheal tube seen 7.4 cm above the carina at the level of the clavicular heads. Nasogastric tube extends into the upper abdomen with its tip at the expected distal body of the stomach. Esophageal temperature probe overlies the expected mid esophagus. The lungs are symmetrically well expanded and are clear. No pneumothorax or pleural effusion. Cardiac size is within normal limits. Central pulmonary arteries are enlarged in keeping with changes of pulmonary arterial hypertension. No superimposed overt pulmonary edema. No acute bone abnormality. Previously noted extensive free intraperitoneal gas with beneath hemidiaphragms is no longer visualized. IMPRESSION: 1. Endotracheal tube 7.4 cm above the  carina. 2. Nasogastric tube in appropriate position. 3. No radiographic evidence of  acute cardiopulmonary disease. Electronically Signed   By: Helyn Numbers M.D.   On: 10/12/2022 01:38   CT ABDOMEN PELVIS WO CONTRAST  Result Date: 10/05/2022 CLINICAL DATA:  Abdominal pain. Free air noted on the earlier radiograph. EXAM: CT ABDOMEN AND PELVIS WITHOUT CONTRAST TECHNIQUE: Multidetector CT imaging of the abdomen and pelvis was performed following the standard protocol without IV contrast. RADIATION DOSE REDUCTION: This exam was performed according to the departmental dose-optimization program which includes automated exposure control, adjustment of the mA and/or kV according to patient size and/or use of iterative reconstruction technique. COMPARISON:  Chest radiograph dated 10/03/2022 and CT of the abdomen pelvis dated 06/09/2018. FINDINGS: Evaluation of this exam is limited in the absence of intravenous contrast. Lower chest: Mild eventration of the right hemidiaphragm. Minimal right lung base atelectasis. The visualized lung bases are otherwise clear. There is coronary vascular calcification. There is large pneumoperitoneum.  Small ascites. Hepatobiliary: The liver is grossly unremarkable. No biliary ductal dilatation. The gallbladder is unremarkable. Pancreas: The pancreas is unremarkable Spleen: Normal in size without focal abnormality. Adrenals/Urinary Tract: Vascular calcification versus nonobstructing bilateral renal calculi. There is no hydronephrosis on either side. Left renal upper pole cyst measuring 8 cm. Several additional bilateral renal hypodense lesions suboptimally characterized on this noncontrast CT. These can be better evaluated with ultrasound on a nonemergent/outpatient basis. The visualized ureters appear unremarkable. Urinary bladder is collapsed. Stomach/Bowel: Bilateral inguinal hernias, right greater than left containing loops of small bowel. The neck of the right inguinal hernia defect  measures approximately 3.8 cm in greatest axial diameter. There is apparent in pinching of the neck of the herniated loop of bowel in the left inguinal canal. There is dilatation of small-bowel loops measuring up to approximately 4 cm consistent with obstruction. The point of obstruction is likely in the region of the left inguinal canal. There is severe diffuse colonic diverticulosis. There is diffuse mesenteric edema and diffuse thickened and inflamed loops of bowel concerning for ischemia. Clinical correlation and surgical consult is advised. The appendix is poorly visualized. Vascular/Lymphatic: Advanced aortoiliac atherosclerotic disease. The IVC is unremarkable. No portal venous gas. Left retroperitoneal adenopathy measures 14 mm in short axis. Reproductive: The uterus is poorly visualized and suboptimally evaluated. Other: Mild diffuse subcutaneous edema. Musculoskeletal: Osteopenia with degenerative changes of the spine. No acute osseous pathology. IMPRESSION: 1. Large pneumoperitoneum with findings concerning for bowel ischemia, likely secondary to obstruction in the left inguinal hernia. Surgical consult is advised. 2. Severe diffuse colonic diverticulosis. 3. Left retroperitoneal adenopathy, likely reactive. 4.  Aortic Atherosclerosis (ICD10-I70.0). These results were called by telephone at the time of interpretation on 09/26/2022 at 8:11 pm to provider MARK QUALE , who verbally acknowledged these results. Electronically Signed   By: Elgie Collard M.D.   On: 09/30/2022 20:14   CT Head Wo Contrast  Result Date: 10/08/2022 CLINICAL DATA:  Weakness, confusion EXAM: CT HEAD WITHOUT CONTRAST TECHNIQUE: Contiguous axial images were obtained from the base of the skull through the vertex without intravenous contrast. RADIATION DOSE REDUCTION: This exam was performed according to the departmental dose-optimization program which includes automated exposure control, adjustment of the mA and/or kV according  to patient size and/or use of iterative reconstruction technique. COMPARISON:  None Available. FINDINGS: Brain: No evidence of acute infarction, hemorrhage, hydrocephalus, extra-axial collection or mass lesion/mass effect. Periventricular and deep white matter hypodensity. Vascular: No hyperdense vessel or unexpected calcification. Skull: Normal. Negative for fracture or focal lesion. Sinuses/Orbits: No acute finding. Other: None. IMPRESSION: No  acute intracranial pathology. Small-vessel white matter disease. Electronically Signed   By: Jearld Lesch M.D.   On: 09/17/2022 20:07   DG Chest Port 1 View  Result Date: 09/13/2022 CLINICAL DATA:  Questionable sepsis EXAM: PORTABLE CHEST 1 VIEW COMPARISON:  Chest x-ray 07/30/2022.  Chest CT 04/22/2022. FINDINGS: There is a large amount of free intraperitoneal air in the upper abdomen. There is some prominent/dilated small bowel loops in the upper abdomen. The heart size and mediastinal contours are within normal limits. Both lungs are clear. The visualized skeletal structures are unremarkable. IMPRESSION: 1. Large amount of free intraperitoneal air in the upper abdomen. 2. Prominent/dilated small bowel loops in the upper abdomen, concerning for small bowel obstruction. These results were called by telephone at the time of interpretation on 09/24/2022 at 7:20 pm to provider MARK QUALE , who verbally acknowledged these results. Electronically Signed   By: Darliss Cheney M.D.   On: 10/08/2022 19:21    Microbiology Recent Results (from the past 240 hour(s))  Blood Culture (routine x 2)     Status: None (Preliminary result)   Collection Time: 09/14/2022  7:32 PM   Specimen: BLOOD  Result Value Ref Range Status   Specimen Description BLOOD LEFT FOREARM  Final   Special Requests   Final    BOTTLES DRAWN AEROBIC AND ANAEROBIC Blood Culture results may not be optimal due to an inadequate volume of blood received in culture bottles   Culture   Final    NO GROWTH < 12  HOURS Performed at [facility], [address] [place], [place] [address]    Report Status PENDING  Incomplete  Blood Culture (routine x 2)     Status: None (Preliminary result)   Collection Time: 09/15/2022  9:17 PM   Specimen: BLOOD  Result Value Ref Range Status   Specimen Description BLOOD BLOOD RIGHT HAND  Final   Special Requests   Final    BOTTLES DRAWN AEROBIC AND ANAEROBIC Blood Culture results may not be optimal due to an inadequate volume of blood received in culture bottles   Culture   Final    NO GROWTH < 12 HOURS Performed at [facility], [address] [place], [place] [address]    Report Status PENDING  Incomplete  C Difficile Quick Screen w PCR reflex     Status: None (Preliminary result)   Collection Time: 09/14/2022  9:45 PM   Specimen: STOOL  Result Value Ref Range Status   C Diff interpretation PENDING  Incomplete  Gastrointestinal Panel by PCR , Stool     Status: None   Collection Time: 2022/10/30  8:00 AM  Result Value Ref Range Status   Campylobacter species NOT DETECTED NOT DETECTED Final   Plesimonas shigelloides NOT DETECTED NOT DETECTED Final   Salmonella species NOT DETECTED NOT DETECTED Final   Yersinia enterocolitica NOT DETECTED NOT DETECTED Final   Vibrio species NOT DETECTED NOT DETECTED Final   Vibrio cholerae NOT DETECTED NOT DETECTED Final   Enteroaggregative E coli (EAEC) NOT DETECTED NOT DETECTED Final   Enteropathogenic E coli (EPEC) NOT DETECTED NOT DETECTED Final   Enterotoxigenic E coli (ETEC) NOT DETECTED NOT DETECTED Final   Shiga like toxin producing E coli (STEC) NOT DETECTED NOT DETECTED Final   Shigella/Enteroinvasive E coli (EIEC) NOT DETECTED NOT DETECTED Final   Cryptosporidium NOT DETECTED NOT DETECTED Final   Cyclospora cayetanensis NOT DETECTED NOT DETECTED Final   Entamoeba histolytica NOT DETECTED NOT DETECTED Final  Giardia lamblia NOT DETECTED NOT DETECTED Final   Adenovirus F40/41 NOT DETECTED NOT  DETECTED Final   Astrovirus NOT DETECTED NOT DETECTED Final   Norovirus GI/GII NOT DETECTED NOT DETECTED Final   Rotavirus A NOT DETECTED NOT DETECTED Final   Sapovirus (I, II, IV, and V) NOT DETECTED NOT DETECTED Final    Comment: Performed at [facility], [address], [place], [place] [address]  C Difficile Quick Screen w PCR reflex     Status: None   Collection Time: 09/24/2022  8:00 AM  Result Value Ref Range Status   C Diff antigen NEGATIVE NEGATIVE Final   C Diff toxin NEGATIVE NEGATIVE Final   C Diff interpretation No C. difficile detected.  Final    Comment: Performed at [facility], [address] [place], [place] [address]    Lab Basic Metabolic Panel: Recent Labs  Lab 2022-10-26 2015 09/15/2022 0154  NA 138 138  K 3.5 3.2*  CL 102 105  CO2 15* 18*  GLUCOSE 81 192*  BUN 91* 84*  CREATININE 4.37* 3.67*  CALCIUM 7.3* 6.7*   Liver Function Tests: Recent Labs  Lab 2022-10-26 2015  AST 37  ALT 8  ALKPHOS 72  BILITOT 0.7  PROT 6.6  ALBUMIN 2.7*   No results for input(s): "LIPASE", "AMYLASE" in the last 168 hours. No results for input(s): "AMMONIA" in the last 168 hours. CBC: Recent Labs  Lab 26-Oct-2022 2015 10/02/2022 0154 09/21/2022 1031  WBC 3.9* 2.9* 7.4  NEUTROABS 3.3  --  6.8  HGB 10.6* 8.6* 9.5*  HCT 39.8 30.5* 33.1*  MCV 87.7 82.7 80.1  PLT 308 441* 440*   Cardiac Enzymes: Recent Labs  Lab October 26, 2022 2145  CKTOTAL 335*   Sepsis Labs: Recent Labs  Lab 10-26-22 2015 10/26/22 2032 09/25/2022 0143 09/26/2022 0154 09/17/2022 0743 10/06/2022 1031  PROCALCITON  --   --  42.29  --   --   --   WBC 3.9*  --   --  2.9*  --  7.4  LATICACIDVEN  --  1.3  --  1.6 2.8* 1.7    Procedures/Operations  Laparotomy for right hemicolectomy with end ileostomy and extensive lysis of adhesions Central line placement Arterial line placement   Syair Fricker 09/20/2022, 4:18 PM

## 2022-10-14 NOTE — Progress Notes (Signed)
[place] Progress Note Patient Name: Paige Parker DOB: 09-Jun-1936 MRN: [id number]   Date of Service  09/19/2022  HPI/Events of Note  Brief New admit note:  to ICU post op s/p Right hemicolectomy with end ileostomy, extensive lysis of adhesions. PCCM consulted for vent  and pressor management  86 y.o with significant PMH of COPD,former smoker, B12 deficiency, arthritis, Aortic Aneurysm, hypothyroidism, dementia, hypertension, GERD, basal cell carcinoma and iron deficiency anemia who presented to the ED with chief complaints of altered mental status, speech changes, diarrhea and generalized weakness.  Data: reviewed Chemistry:Na+/ K+: 138/3.5 Glucose:81  BUN/Cr:  91/4.37 CBC: WBC:3.9  Hgb/Hct: 10.6/39.8  Other Lab findings: Lactic acid:1.3~ 1.6   Imaging:  CXR> Large amount of free intraperitoneal air in the upper abdomen. SBO. CTH>No acute intracranial abnormality CT Abd/pelvis>Large pneumoperitoneum with findings concerning for bowel ischemia, likely secondary to obstruction in the left inguinal hernia EKG: sinus , occasional PAC's.   Camera evaluation done: Resting. In synchrony with lung protective ventilation. MAP 62, HR 76.  On pressor.  A/P:  Septic shock, perf viscous, s/p GI surgery, ileostomy. Anemia - on antibiotics. Fluids and pressors.  2. AHRF on Ventilator. COPD. VT < 8 ml/ibw. On sedation. VAP bundle, nebs.  - daily SAT/SBT when passing weaning trial.   3. AKI/ATN, improving. Keep K/Mag > 4 /2 respectively  4. Encephalopathy from above. Underlying dementia.   VTE: SCD CBG goals < 180. on SSI.  Vent bundle: SAT-SBT daily when stable hemodynamics. Lung protective ventilation.   Omri Bertran. Neomia Glass, MD, FCCP. [phone]  eICU Interventions  As above.      Intervention Category Major Interventions: Respiratory failure - evaluation and management;Other: (s/p ileostomy on vent) Intermediate Interventions: Best-practice therapies (e.g. DVT, beta  blocker, etc.) Evaluation Type: New Patient Evaluation  Elmer Sow 09/21/2022, 5:20 AM

## 2022-10-14 DEATH — deceased

## 2022-10-15 LAB — CULTURE, BLOOD (ROUTINE X 2)
Culture: NO GROWTH
Culture: NO GROWTH

## 2022-10-25 LAB — BLOOD GAS, ARTERIAL
Acid-base deficit: 14.6 mmol/L — ABNORMAL HIGH (ref 0.0–2.0)
Acid-base deficit: 14 mmol/L — ABNORMAL HIGH (ref 0.0–2.0)
Bicarbonate: 15.4 mmol/L — ABNORMAL LOW (ref 20.0–28.0)
Bicarbonate: 14.6 mmol/L — ABNORMAL LOW (ref 20.0–28.0)
FIO2: 100 %
FIO2: 70 %
MECHVT: 400 mL
MECHVT: 490 mL
Mechanical Rate: 23
Mechanical Rate: 24
O2 Saturation: 99.1 %
O2 Saturation: 99.4 %
PEEP: 7 cmH2O
PEEP: 7 cmH2O
Patient temperature: 37
Patient temperature: 37
pCO2 arterial: 52 mmHg — ABNORMAL HIGH (ref 32–48)
pCO2 arterial: 43 mmHg (ref 32–48)
pH, Arterial: 7.08 — CL (ref 7.35–7.45)
pH, Arterial: 7.14 — CL (ref 7.35–7.45)
pO2, Arterial: 286 mmHg — ABNORMAL HIGH (ref 83–108)
pO2, Arterial: 163 mmHg — ABNORMAL HIGH (ref 83–108)

## 2022-10-26 ENCOUNTER — Encounter: Payer: Self-pay | Admitting: Surgery
